# Patient Record
Sex: Male | Born: 1950 | Race: White | Hispanic: No | Marital: Married | Smoking: Former smoker
Health system: Southern US, Community
[De-identification: ages and names within clinical notes are randomized; demographics above are authoritative.]

## PROBLEM LIST (undated history)

## (undated) DIAGNOSIS — T7840XA Allergy, unspecified, initial encounter: Secondary | ICD-10-CM

## (undated) DIAGNOSIS — E785 Hyperlipidemia, unspecified: Secondary | ICD-10-CM

## (undated) DIAGNOSIS — G4730 Sleep apnea, unspecified: Secondary | ICD-10-CM

## (undated) DIAGNOSIS — B019 Varicella without complication: Secondary | ICD-10-CM

## (undated) DIAGNOSIS — K5792 Diverticulitis of intestine, part unspecified, without perforation or abscess without bleeding: Secondary | ICD-10-CM

## (undated) DIAGNOSIS — M1990 Unspecified osteoarthritis, unspecified site: Secondary | ICD-10-CM

## (undated) HISTORY — DX: Diverticulitis of intestine, part unspecified, without perforation or abscess without bleeding: K57.92

## (undated) HISTORY — DX: Hyperlipidemia, unspecified: E78.5

## (undated) HISTORY — DX: Varicella without complication: B01.9

## (undated) HISTORY — DX: Allergy, unspecified, initial encounter: T78.40XA

## (undated) HISTORY — DX: Unspecified osteoarthritis, unspecified site: M19.90

## (undated) HISTORY — PX: EYE SURGERY: SHX253

## (undated) HISTORY — DX: Sleep apnea, unspecified: G47.30

---
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
## 2001-07-10 ENCOUNTER — Ambulatory Visit (HOSPITAL_BASED_OUTPATIENT_CLINIC_OR_DEPARTMENT_OTHER): Admission: RE | Admit: 2001-07-10 | Discharge: 2001-07-10 | Payer: Self-pay | Admitting: Family Medicine

## 2002-11-15 HISTORY — PX: CARPAL TUNNEL RELEASE: SHX101

## 2004-06-12 ENCOUNTER — Encounter: Admission: RE | Admit: 2004-06-12 | Discharge: 2004-06-12 | Payer: Self-pay | Admitting: Family Medicine

## 2004-06-15 ENCOUNTER — Encounter: Admission: RE | Admit: 2004-06-15 | Discharge: 2004-06-15 | Payer: Self-pay | Admitting: Family Medicine

## 2004-08-28 ENCOUNTER — Ambulatory Visit (HOSPITAL_COMMUNITY): Admission: RE | Admit: 2004-08-28 | Discharge: 2004-08-28 | Payer: Self-pay | Admitting: Neurosurgery

## 2004-08-28 ENCOUNTER — Ambulatory Visit (HOSPITAL_BASED_OUTPATIENT_CLINIC_OR_DEPARTMENT_OTHER): Admission: RE | Admit: 2004-08-28 | Discharge: 2004-08-28 | Payer: Self-pay | Admitting: Neurosurgery

## 2007-02-27 ENCOUNTER — Ambulatory Visit (HOSPITAL_BASED_OUTPATIENT_CLINIC_OR_DEPARTMENT_OTHER): Admission: RE | Admit: 2007-02-27 | Discharge: 2007-02-27 | Payer: Self-pay | Admitting: Orthopedic Surgery

## 2007-11-16 HISTORY — PX: KNEE ARTHROSCOPY: SUR90

## 2011-07-29 ENCOUNTER — Encounter: Payer: Self-pay | Admitting: Gastroenterology

## 2012-08-10 ENCOUNTER — Encounter: Payer: Self-pay | Admitting: Gastroenterology

## 2012-08-28 ENCOUNTER — Encounter: Payer: Self-pay | Admitting: Medical

## 2012-11-15 HISTORY — PX: CATARACT EXTRACTION, BILATERAL: SHX1313

## 2012-12-04 ENCOUNTER — Ambulatory Visit: Payer: Self-pay | Admitting: Family Medicine

## 2013-01-15 ENCOUNTER — Ambulatory Visit: Payer: Self-pay | Admitting: Family Medicine

## 2013-02-05 ENCOUNTER — Ambulatory Visit (INDEPENDENT_AMBULATORY_CARE_PROVIDER_SITE_OTHER): Payer: BC Managed Care – PPO | Admitting: Family Medicine

## 2013-02-05 ENCOUNTER — Encounter: Payer: Self-pay | Admitting: *Deleted

## 2013-02-05 ENCOUNTER — Encounter: Payer: Self-pay | Admitting: Family Medicine

## 2013-02-05 ENCOUNTER — Encounter: Payer: Self-pay | Admitting: Gastroenterology

## 2013-02-05 VITALS — BP 130/100 | HR 90 | Temp 98.7°F | Ht 73.0 in | Wt 275.0 lb

## 2013-02-05 DIAGNOSIS — Z1211 Encounter for screening for malignant neoplasm of colon: Secondary | ICD-10-CM

## 2013-02-05 DIAGNOSIS — Z0000 Encounter for general adult medical examination without abnormal findings: Secondary | ICD-10-CM | POA: Insufficient documentation

## 2013-02-05 LAB — CBC WITH DIFFERENTIAL/PLATELET
Basophils Absolute: 0 10*3/uL (ref 0.0–0.1)
Basophils Relative: 0.3 % (ref 0.0–3.0)
Eosinophils Relative: 0.8 % (ref 0.0–5.0)
Lymphs Abs: 1.6 10*3/uL (ref 0.7–4.0)
Monocytes Relative: 8.3 % (ref 3.0–12.0)
Neutrophils Relative %: 69.7 % (ref 43.0–77.0)
Platelets: 237 10*3/uL (ref 150.0–400.0)

## 2013-02-05 LAB — LIPID PANEL
HDL: 47.9 mg/dL (ref >39.00)
VLDL: 27.4 mg/dL (ref 0.0–40.0)

## 2013-02-05 LAB — HEPATIC FUNCTION PANEL
Alkaline Phosphatase: 52 U/L (ref 39–117)
Bilirubin, Direct: 0.1 mg/dL (ref 0.0–0.3)

## 2013-02-05 LAB — BASIC METABOLIC PANEL
BUN: 12 mg/dL (ref 6–23)
CO2: 27 mEq/L (ref 19–32)
Calcium: 9.1 mg/dL (ref 8.4–10.5)
Chloride: 101 mEq/L (ref 96–112)
GFR: 93.58 mL/min (ref >60.00)
Sodium: 136 mEq/L (ref 135–145)

## 2013-02-05 LAB — TSH: TSH: 1.16 u[IU]/mL (ref 0.35–5.50)

## 2013-02-05 LAB — LDL CHOLESTEROL, DIRECT: Direct LDL: 147.3 mg/dL

## 2013-02-05 NOTE — Assessment & Plan Note (Signed)
Pt's PE WNL w/ exception of obesity.  Refer for colonoscopy.  EKG done- see document for interpretation.  Check labs.  Anticipatory guidance provided.

## 2013-02-05 NOTE — Progress Notes (Signed)
  Subjective:    Patient ID: Jesse [name], male    DOB: 06/06/1951, 62 y.o.   MRN: [id number]  HPI New to establish.  Previous MD- Mazzochi (last seen years ago)  CPE- overdue on colonoscopy.   Review of Systems Patient reports no vision/hearing changes, anorexia, fever ,adenopathy, persistant/recurrent hoarseness, swallowing issues, chest pain, palpitations, edema, persistant/recurrent cough, hemoptysis, dyspnea (rest,exertional, paroxysmal nocturnal), gastrointestinal  bleeding (melena, rectal bleeding), abdominal pain, excessive heart burn, GU symptoms (dysuria, hematuria, voiding/incontinence issues) syncope, focal weakness, memory loss, numbness & tingling, skin/hair/nail changes, depression, anxiety, abnormal bruising/bleeding, musculoskeletal symptoms/signs.     Objective:   Physical Exam BP 130/100  Pulse 90  Temp(Src) 98.7 F (37.1 C) (Oral)  Ht 6\' 1"  (1.854 m)  Wt 275 lb (124.739 kg)  BMI 36.29 kg/m2  SpO2 92%  General Appearance:    Alert, cooperative, no distress, appears stated age, overweight  Head:    Normocephalic, without obvious abnormality, atraumatic  Eyes:    PERRL, conjunctiva/corneas clear, EOM's intact  Ears:    Normal TM's and external ear canals, both ears  Nose:   Nares normal, septum midline, mucosa normal, no drainage   or sinus tenderness  Throat:   Lips, mucosa, and tongue normal; teeth and gums normal  Neck:   Supple, symmetrical, trachea midline, no adenopathy;       thyroid:  No enlargement/tenderness/nodules  Back:     Symmetric, no curvature, ROM normal, no CVA tenderness  Lungs:     Clear to auscultation bilaterally, respirations unlabored  Chest wall:    No tenderness or deformity  Heart:    Regular rate and rhythm, S1 and S2 normal, no murmur, rub   or gallop  Abdomen:     Soft, non-tender, bowel sounds active all four quadrants,    no masses, no organomegaly  Genitalia:    Normal male without lesion, discharge or tenderness  Rectal:     Normal tone, normal prostate, no masses or tenderness  Extremities:   Extremities normal, atraumatic, no cyanosis or edema  Pulses:   2+ and symmetric all extremities  Skin:   Skin color, texture, turgor normal, no rashes or lesions  Lymph nodes:   Cervical, supraclavicular, and axillary nodes normal  Neurologic:   CNII-XII intact. Normal strength, sensation and reflexes      throughout          Assessment & Plan:

## 2013-02-05 NOTE — Patient Instructions (Addendum)
Follow up in 3 months to recheck BP We'll notify you of your lab results and make any changes if needed Try and make healthy food choices and get activity when able We'll call you with your GI appt for the colonoscopy Call with any questions or concerns Welcome!  We're glad to have you! Happy Early [place] Ouch!

## 2013-03-14 ENCOUNTER — Encounter: Payer: BC Managed Care – PPO | Admitting: Gastroenterology

## 2013-03-19 ENCOUNTER — Encounter: Payer: Self-pay | Admitting: Gastroenterology

## 2013-03-19 ENCOUNTER — Ambulatory Visit (AMBULATORY_SURGERY_CENTER): Payer: BC Managed Care – PPO | Admitting: *Deleted

## 2013-03-19 VITALS — Ht 72.0 in | Wt 277.2 lb

## 2013-03-19 DIAGNOSIS — Z1211 Encounter for screening for malignant neoplasm of colon: Secondary | ICD-10-CM

## 2013-03-19 MED ORDER — NA SULFATE-K SULFATE-MG SULF 17.5-3.13-1.6 GM/177ML PO SOLN: ORAL | Status: DC

## 2013-03-20 ENCOUNTER — Encounter: Payer: BC Managed Care – PPO | Admitting: Gastroenterology

## 2013-04-02 ENCOUNTER — Encounter: Payer: BC Managed Care – PPO | Admitting: Gastroenterology

## 2013-05-14 ENCOUNTER — Encounter: Payer: Self-pay | Admitting: Family Medicine

## 2013-05-14 ENCOUNTER — Ambulatory Visit (INDEPENDENT_AMBULATORY_CARE_PROVIDER_SITE_OTHER): Payer: BC Managed Care – PPO | Admitting: Family Medicine

## 2013-05-14 VITALS — BP 120/82 | HR 80 | Temp 98.0°F | Ht 73.0 in | Wt 273.8 lb

## 2013-05-14 DIAGNOSIS — G4733 Obstructive sleep apnea (adult) (pediatric): Secondary | ICD-10-CM

## 2013-05-14 DIAGNOSIS — R030 Elevated blood-pressure reading, without diagnosis of hypertension: Secondary | ICD-10-CM

## 2013-05-14 NOTE — Patient Instructions (Addendum)
We'll call you with your pulmonary appt for the sleep apnea BP looks great! Call with any questions or concerns Have a great summer!

## 2013-05-14 NOTE — Assessment & Plan Note (Signed)
Improved.  BP normal today.  Asymptomatic.  Will follow at future visits.

## 2013-05-14 NOTE — Progress Notes (Signed)
  Subjective:    Patient ID: Jesse [name], male    DOB: [birth year], 62 y.o.   MRN: [id number]  HPI Elevated BP- much improved today over last visit.  No CP, SOB, HAs, visual changes, edema.  ? Sleep apnea- no trouble falling asleep but difficulty staying asleep.  Wife reports loud snoring and breathing pauses.  Had sleep study 11 yrs ago that indicated sleep apnea but did not return for f/u study to fit face mask or determine appropriate tx.  + daytime sleepiness.   Review of Systems For ROS see HPI     Objective:   Physical Exam  Vitals reviewed. Constitutional: He is oriented to person, place, and time. He appears well-developed and well-nourished. No distress.  HENT:  Head: Normocephalic and atraumatic.  Eyes: Conjunctivae and EOM are normal. Pupils are equal, round, and reactive to light.  Neck: Normal range of motion. Neck supple. No thyromegaly present.  Cardiovascular: Normal rate, regular rhythm, normal heart sounds and intact distal pulses.   No murmur heard. Pulmonary/Chest: Effort normal and breath sounds normal. No respiratory distress.  Abdominal: Soft. Bowel sounds are normal. He exhibits no distension.  Musculoskeletal: He exhibits no edema.  Lymphadenopathy:    He has no cervical adenopathy.  Neurological: He is alert and oriented to person, place, and time. No cranial nerve deficit.  Skin: Skin is warm and dry.  Psychiatric: He has a normal mood and affect. His behavior is normal.          Assessment & Plan:

## 2013-05-14 NOTE — Assessment & Plan Note (Signed)
New to provider.  Pt has hx of similar but never followed up as recommended.  Will refer to pulm for re-eval and tx prn.

## 2013-06-08 ENCOUNTER — Institutional Professional Consult (permissible substitution): Payer: BC Managed Care – PPO | Admitting: Pulmonary Disease

## 2013-07-02 ENCOUNTER — Encounter: Payer: Self-pay | Admitting: Family Medicine

## 2013-07-02 ENCOUNTER — Ambulatory Visit (INDEPENDENT_AMBULATORY_CARE_PROVIDER_SITE_OTHER): Payer: BC Managed Care – PPO | Admitting: Pulmonary Disease

## 2013-07-02 ENCOUNTER — Encounter: Payer: Self-pay | Admitting: Pulmonary Disease

## 2013-07-02 VITALS — BP 148/100 | HR 73 | Temp 98.2°F | Ht 72.75 in | Wt 273.0 lb

## 2013-07-02 DIAGNOSIS — H9190 Unspecified hearing loss, unspecified ear: Secondary | ICD-10-CM

## 2013-07-02 DIAGNOSIS — G4733 Obstructive sleep apnea (adult) (pediatric): Secondary | ICD-10-CM

## 2013-07-02 NOTE — Assessment & Plan Note (Signed)
The patient has a history of obstructive sleep apnea, although his sleep study from the early 2000 is not available for my review.  His current history is classic for sleep disordered breathing, and I have had a long discussion with him about the pathophysiology of sleep apnea.  I think he needs to have a sleep study at this point, but I am hesitant to do sleep testing given his frequent awakenings with delayed return to sleep thereafter.  I will schedule for a formal sleep study at the sleep Center, and arrange for followup once the results are available.

## 2013-07-02 NOTE — Progress Notes (Signed)
Subjective:    Patient ID: Jesse [name], male    DOB: 1951/08/25, 62 y.o.   MRN: [id number]  HPI The patient is a 62 year old male who has been asked to see for possible obstructive sleep apnea.  He apparently has had a sleep study in 2002, and was diagnosed with sleep apnea at that time.  However, he never returned for possible treatment.  The patient currently has been noted to have loud snoring as well as an abnormal breathing pattern during sleep.  He has frequent awakenings at night, sometimes associated with a delayed return to sleep.  He is not rested in the mornings upon arising, and states that he will fall asleep easily during the day with any period of inactivity.  He also falls asleep watching television or movies in the evening, and also has sleep pressure with driving longer distances.  The patient states his weight is up 50 pounds over the last 2 years, and his Epworth score today is 14.    Sleep Questionnaire What time do you typically go to bed?( Between what hours) 9-10 9-10 at 0915 on 07/02/13 by Maisie Fus, CMA How long does it take you to fall asleep? within minutes within minutes at 0915 on 07/02/13 by Maisie Fus, CMA How many times during the night do you wake up? 4 4 at 0915 on 07/02/13 by Maisie Fus, CMA What time do you get out of bed to start your day? 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 (In pounds) 50 lb (22.68 kg)50 lb (22.68 kg) increase at 0915 on 07/02/13 by Maisie Fus, CMA Have you ever had a sleep study before? Yes Yes at 0915 on 07/02/13 by Maisie Fus, CMA If yes, location of study? [facility] at 0915 on 07/02/13 by Maisie Fus, CMA If yes, date of study? 18yrs ago 89yrs ago at 0915 on  07/02/13 by Maisie Fus, CMA Do you currently use CPAP? No No at 0915 on 07/02/13 by Maisie Fus, CMA Do you wear oxygen at any time? No No at 0915 on 07/02/13 by Maisie Fus, CMA   Review of Systems  Constitutional: Negative for fever and unexpected weight change.  HENT: Positive for congestion. Negative for ear pain, nosebleeds, sore throat, rhinorrhea, sneezing, trouble swallowing, dental problem, postnasal drip and sinus pressure.   Eyes: Negative for redness and itching.  Respiratory: Negative for cough, chest tightness, shortness of breath and wheezing.   Cardiovascular: Negative for palpitations and leg swelling.  Gastrointestinal: Negative for nausea and vomiting.  Genitourinary: Negative for dysuria.  Musculoskeletal: Negative for joint swelling.  Skin: Negative for rash.  Neurological: Negative for headaches.  Hematological: Does not bruise/bleed easily.  Psychiatric/Behavioral: Negative for dysphoric mood. The patient is not nervous/anxious.        Objective:   Physical Exam Constitutional:  Obese male , no acute distress  HENT:  Nares patent without discharge, but narrowed bilat.  Oropharynx without exudate, palate and uvula are moderately elongated.   Eyes:  Perrla, eomi, no scleral icterus  Neck:  No JVD, no TMG  Cardiovascular:  Normal rate, regular rhythm, no rubs or gallops.  No murmurs        Intact distal  pulses  Pulmonary :  Normal breath sounds, no stridor or respiratory distress   No rales, rhonchi, or wheezing  Abdominal:  Soft, nondistended, bowel sounds present.  No tenderness noted.   Musculoskeletal:  No lower extremity edema noted.  Lymph Nodes:  No cervical lymphadenopathy noted  Skin:  No cyanosis noted  Neurologic:  Alert, appropriate, moves all 4 extremities without obvious deficit.         Assessment & Plan:

## 2013-07-02 NOTE — Patient Instructions (Addendum)
Will arrange for sleep study, and will see back to review once the report is available. Work on weight loss.

## 2013-07-03 ENCOUNTER — Encounter: Payer: Self-pay | Admitting: Family Medicine

## 2013-07-03 NOTE — Telephone Encounter (Signed)
Pt last seen on 05/14/13. Would you like to see pt first or would you like for me to place a referral?

## 2013-08-06 ENCOUNTER — Ambulatory Visit: Payer: BC Managed Care – PPO | Attending: Family Medicine | Admitting: Audiology

## 2013-08-06 DIAGNOSIS — H903 Sensorineural hearing loss, bilateral: Secondary | ICD-10-CM | POA: Insufficient documentation

## 2013-08-06 DIAGNOSIS — H93293 Other abnormal auditory perceptions, bilateral: Secondary | ICD-10-CM

## 2013-08-06 NOTE — Patient Instructions (Addendum)
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  You have excellent word recognition in quiet, that drops to poor in minimal background noise in each ear alone. However, when both ears have background noise, word recognition is 72%, which is good  1. CAPTEL - www.captel.com   2.  Hearing aid recommended  [facility] [address] [place], [place] [phone]  [facility] [address] Gita Kudo [place], [place]  [phone] [facility]  [place]  [phone] and [place]  [phone] [facility] Mady Haagensen [phone] and [place] [phone] [facility]  [phone]  [facility] [phone]  [facility] [phone], [place] [phone], Mady Haagensen [phone]   [facility]  [place]  [phone]  [employer] and [facility] also dispense hearing aids.    Hearing Loss A hearing loss is sometimes called deafness. Hearing loss may be partial or total. CAUSES Hearing loss may be caused by:  Wax in the ear canal.  Infection of the ear canal.  Infection of the middle ear.  Trauma to the ear or surrounding area.  Fluid in the middle ear.  A hole in the eardrum (perforated eardrum).  Exposure to loud sounds or music.  Problems with the hearing nerve.  Certain medications. Hearing loss without wax, infection, or a history of injury may mean that the nerve is involved. Hearing loss with severe dizziness, nausea and vomiting or ringing in the ear may suggest a hearing nerve irritation or problems in the middle or inner ear. If hearing loss is untreated, there is a greater likelihood for residual or permanent hearing loss. DIAGNOSIS A hearing test (audiometry) assesses hearing loss. The audiometry test needs to be performed by a hearing specialist  (audiologist). TREATMENT Treatment for recent onset of hearing loss may include:  Ear wax removal.  Medications that kill germs (antibiotics).  Cortisone medications.  Prompt follow up with the appropriate specialist. Return of hearing depends on the cause of your hearing loss, so proper medical follow-up is important. Some hearing loss may not be reversible, and a caregiver should discuss care and treatment options with you. SEEK MEDICAL CARE IF:   You have a severe headache, dizziness, or changes in vision.  You have new or increased weakness.  You develop repeated vomiting or other serious medical problems.  You have a fever. Document Released: 11/01/2005 Document Revised: 01/24/2012 Document Reviewed: 02/26/2010 [facility] Patient Information 2014 [place], [place].

## 2013-08-06 NOTE — Procedures (Signed)
[facility] [address] [place], [place] [address] [phone]  AUDIOLOGICAL EVALUATION  Name: Jesse [name] DOB:  Apr 09, 1951 MRN:  [id number]                                           Diagnosis: Hearing Loss Date: 08/06/2013    Referent: Neena Rhymes, MD  HISTORY: Jhalen Eley, age 62 y.o. years, was seen for an audiological evaluation and reports a "gradual hearing loss over the past 10 years in both ears."  Kamdyn denies tinnitus, vertigo or balance issues.  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         EVALUATION: Pure tone air and tone conduction was completed using conventional audiometry and inserts. Thresholds are symmetrical and are 35 dBHL at 250Hz ; 30 dBHL at 500Hz ; 40-45 dBHL at 1000Hz ; 55-60 dBHL at 1500Hz ; 65 dBHL at 2000Hz  - 4000Hz  and 65-75 dBHL at 8000Hz .  The hearing loss is sensorineural bilaterally except for slight left ear bone conduction fluctuation at 2000Hz  - 4000Hz  that is not considered significant. Speech reception is 40 dBHL using recorded spondee words. Word recognition is 92% at 70 dBHL in quiet in each ear. In minimal background noise word recognition drops to 36% in the right ear and 40% in the left ear.  However, with binaural words and speech at +5 dBH signal to noise word recognition improved to 72% correct. Otoscopic inspection reveals clear ear canals with visible tympanic membranes.  Tympanometry showed in the left ear was  (Type A)  and in the right ear was  (Type A).  Ipsilateral acoustic reflexes are present at 500Hz  and 1000Hz  and are absent at 2000Hz  and 4000Hz  bilaterally. Acoustic reflex decay is negative bilaterally.   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  He has excellent word recognition in quiet, that drops to poor in minimal background noise in each ear alone. However, when both ears have background noise, word recognition improves to 72%.  Therefore binaural amplification would be strongly recommended for Mr. Wehner.  Please note that the hearing loss appears symmetrical without sign of retrocochlear issues.   In addition,  Mr. Buer may benefit from the use of a computer program to be used at home to improve auditory processing function.  LACE by www.neurotone or Auditory Workout for apple products were recommended to improve hearing in increasing background noise.  Using one of these programs 10-15 minutes per day 4-5 days per week is strongly recommended. To monitor progress use the hearing test at www.hear-it.com before, during a few weeks of using these programs and then after completion -- it has been studied and found to be a valid measure.  RECOMMENDATIONS: 1.   Monitor hearing with a repeat audiological evaluation in 6-12 months (earlier if there is any change in hearing or ear pressure). 2.   Consider further evaluation by an Ear, Nose and Throat physician, especially if there is any change in hearing or tinnitus develops. 3.   Consider a hearing aid evaluation at the location of Graceson's choice.  A list of local hearing aid providers was given  to Mr. Treece at this visit. Amplification helps make the signal louder and therefore often improves hearing and word recognition.  Amplification has many forms including hearing aids in one or both ears, an assistive listening device which have a microphone and speaker such as a small handheld device and/or even a surround sound system of speakers.  Amplification may be covered by some insurances, but not all.  It is important to note that hearing aids must be individually fit according to the hearing test results and the ear shape.   Audiologists and hearing aid dealers in [place] must be licensed in order to dispense hearing aids.  In addition, a trial period is mandated by law in our state because often amplification must be tried and then evaluated in order to determine benefit. There are many excellent choices when it comes to amplification in our area and providers are listed in the phone book under hearing aids, may be affiliated with Ear, Nose and Throat physicians, are located at [place] and [employer] as well as the [employer] [facility] speech and hearing center. 4.  To improve listening in background noise, consider the use of the at home computer programs that help improve hearing in background noise. 5.  Strategies that help improve hearing include:  A) Face the speaker directly. Optimal is having the speakers face well - lit.  Unless amplified, being within 3-6 feet of the speaker will enhance word recognition.  B) Avoid having the speaker back-lit as this will minimize the ability to use cues from lip-reading, facial expression and gestures.  C)  Word recognition is poorer in background noise. For optimal word recognition, turn off the TV, radio or noisy fan when engaging in conversation. In a restaurant, try to sit away from noise sources and close to the primary speaker.   D)  Ask for topic clarification from time to time in order to remain in the conversation.  Most people don't mind repeating or clarifying a point when asked.  If needed, explain the difficulty hearing in background noise or hearing loss.  Deborah L. Kate Sable Au.D., CCC-A Doctor of Audiology  08/06/2013  cc: Neena Rhymes, MD

## 2013-08-07 ENCOUNTER — Ambulatory Visit (HOSPITAL_BASED_OUTPATIENT_CLINIC_OR_DEPARTMENT_OTHER): Payer: BC Managed Care – PPO | Attending: Pulmonary Disease | Admitting: Radiology

## 2013-08-07 VITALS — Ht 72.0 in | Wt 270.0 lb

## 2013-08-07 DIAGNOSIS — G4733 Obstructive sleep apnea (adult) (pediatric): Secondary | ICD-10-CM

## 2013-08-07 DIAGNOSIS — E669 Obesity, unspecified: Secondary | ICD-10-CM | POA: Insufficient documentation

## 2013-08-15 DIAGNOSIS — G4733 Obstructive sleep apnea (adult) (pediatric): Secondary | ICD-10-CM

## 2013-08-15 NOTE — Sleep Study (Signed)
[facility]  NAME: Jesse [name] DATE OF BIRTH:  [birth year] MEDICAL RECORD NUMBER [id number]  LOCATION: [facility]  PHYSICIAN: Barbaraann Share  DATE OF STUDY: 08/07/2013  SLEEP STUDY TYPE: Nocturnal Polysomnogram               REFERRING PHYSICIAN: Nasia Cannan, Maree Krabbe, MD  INDICATION FOR STUDY: Hypersomnia with sleep apnea  EPWORTH SLEEPINESS SCORE:  12 HEIGHT: 6' (182.9 cm)  WEIGHT: 270 lb (122.471 kg)    Body mass index is 36.61 kg/(m^2).  NECK SIZE: 18.5 in.   SLEEP ARCHITECTURE:  The pt had a total sleep time of , with no SWS and decreased REM.  Sleep onset latency was normal,and sleep efficiency was poor at 61%.  RESPIRATORY DATA: the patient was found to have 168 apneas, as well as 106 obstructive hypopneas, giving him an apnea-hypopnea index of 67 events per hour.  The events occurred in all body positions, and there was moderate to severe snoring noted throughout.  OXYGEN DATA: there was oxygen desaturation as low as 80% with the patient's a truck of events.  CARDIAC DATA: occasional PACs noted, but no clinically significant arrhythmias were seen  MOVEMENT/PARASOMNIA: the patient had no significant leg jerks or other abnormal behaviors noted.  IMPRESSION/ RECOMMENDATION:   #1 severe obstructive sleep apnea/hypopnea syndrome, with an AHI of 67 events per hour and oxygen desaturation as low as 80%.  Treatment for this degree of sleep apnea should focus on CPAP as well as aggressive weight loss. #2 occasional PAC noted, but no clinically significant arrhythmias were seen.  Barbaraann Share Diplomate, American Board of Sleep Medicine  ELECTRONICALLY SIGNED ON:  08/15/2013, 9:01 AM [facility] PH: [phone]   FX: [phone] ACCREDITED BY THE AMERICAN ACADEMY OF SLEEP MEDICINE

## 2013-08-17 ENCOUNTER — Telehealth: Payer: Self-pay | Admitting: *Deleted

## 2013-08-17 NOTE — Telephone Encounter (Signed)
I called pt on home # and cell # to schedule OV to discuss sleep study results. I had to leave message on both #'s. WCB

## 2013-08-17 NOTE — Telephone Encounter (Signed)
Pt coming in 08/31/13

## 2013-08-17 NOTE — Telephone Encounter (Signed)
Pt scheduled ov with KC.Jesse [name]

## 2013-08-31 ENCOUNTER — Encounter: Payer: Self-pay | Admitting: Pulmonary Disease

## 2013-08-31 ENCOUNTER — Ambulatory Visit (INDEPENDENT_AMBULATORY_CARE_PROVIDER_SITE_OTHER): Payer: BC Managed Care – PPO | Admitting: Pulmonary Disease

## 2013-08-31 VITALS — BP 142/88 | HR 94 | Ht 72.0 in | Wt 275.0 lb

## 2013-08-31 DIAGNOSIS — G4733 Obstructive sleep apnea (adult) (pediatric): Secondary | ICD-10-CM

## 2013-08-31 DIAGNOSIS — Z23 Encounter for immunization: Secondary | ICD-10-CM

## 2013-08-31 NOTE — Patient Instructions (Signed)
Will arrange for cpap, and please call if you are having issues with tolerance Work on weight loss followup with me in 6 weeks.

## 2013-08-31 NOTE — Assessment & Plan Note (Signed)
The patient has severe obstructive sleep apnea by his recent sleep study, and I have explained that his best treatment is weight loss while wearing CPAP.  He is agreeable to trying this.  I have also encouraged him to work aggressively on weight loss. I will set the patient up on cpap at a moderate pressure level to allow for desensitization, and will troubleshoot the device over the next 4-6weeks if needed.  The pt is to call me if having issues with tolerance.  Will then optimize the pressure once patient is able to wear cpap on a consistent basis.

## 2013-08-31 NOTE — Progress Notes (Signed)
  Subjective:    Patient ID: Jesse [name], male    DOB: [birth year], 62 y.o.   MRN: [id number]  HPI The patient comes in today for followup of his recent sleep study.  He was found to have severe OSA, with an AHI of 67 events per hour.  I have reviewed his study with him in detail, and answered all of his questions.   Review of Systems  Constitutional: Negative for fever and unexpected weight change.  HENT: Negative for congestion, dental problem, ear pain, nosebleeds, postnasal drip, rhinorrhea, sinus pressure, sneezing, sore throat and trouble swallowing.   Eyes: Negative for redness and itching.  Respiratory: Negative for cough, chest tightness, shortness of breath and wheezing.   Cardiovascular: Negative for palpitations and leg swelling.  Gastrointestinal: Negative for nausea and vomiting.  Genitourinary: Negative for dysuria.  Musculoskeletal: Negative for joint swelling.  Skin: Negative for rash.  Neurological: Negative for headaches.  Hematological: Does not bruise/bleed easily.  Psychiatric/Behavioral: Negative for dysphoric mood. The patient is not nervous/anxious.        Objective:   Physical Exam Overweight male in no acute distress Nose without purulence or discharge noted Neck without lymphadenopathy or thyromegaly Lower extremities with mild edema, no cyanosis Alert and oriented, moves all 4 extremities.       Assessment & Plan:

## 2013-10-01 ENCOUNTER — Encounter: Payer: Self-pay | Admitting: Family Medicine

## 2013-10-01 ENCOUNTER — Ambulatory Visit (INDEPENDENT_AMBULATORY_CARE_PROVIDER_SITE_OTHER): Payer: BC Managed Care – PPO | Admitting: Family Medicine

## 2013-10-01 VITALS — BP 130/80 | HR 93 | Temp 98.1°F | Resp 17 | Wt 274.2 lb

## 2013-10-01 DIAGNOSIS — J449 Chronic obstructive pulmonary disease, unspecified: Secondary | ICD-10-CM | POA: Insufficient documentation

## 2013-10-01 DIAGNOSIS — M25511 Pain in right shoulder: Secondary | ICD-10-CM

## 2013-10-01 DIAGNOSIS — M25512 Pain in left shoulder: Secondary | ICD-10-CM | POA: Insufficient documentation

## 2013-10-01 DIAGNOSIS — M25519 Pain in unspecified shoulder: Secondary | ICD-10-CM

## 2013-10-01 MED ORDER — PROMETHAZINE-DM 6.25-15 MG/5ML PO SYRP: 5.0000 mL | ORAL_SOLUTION | Freq: Four times a day (QID) | ORAL | Status: DC | PRN

## 2013-10-01 MED ORDER — NAPROXEN 500 MG PO TABS: 500.0000 mg | ORAL_TABLET | Freq: Two times a day (BID) | ORAL | Status: DC

## 2013-10-01 MED ORDER — AZITHROMYCIN 250 MG PO TABS: ORAL_TABLET | ORAL | Status: DC

## 2013-10-01 NOTE — Assessment & Plan Note (Signed)
New.  Pt is former cigarette smoker and uses CPAP nightly.  Start Zpack to cover for any atypicals.  Cough meds prn.  Reviewed supportive care and red flags that should prompt return.  Pt expressed understanding and is in agreement w/ plan.

## 2013-10-01 NOTE — Progress Notes (Signed)
  Subjective:    Patient ID: Jesse [name], male    DOB: 10/31/1951, 62 y.o.   MRN: [id number]  HPI Pre visit review using our clinic review tool, if applicable. No additional management support is needed unless otherwise documented below in the visit note.  URI- sxs started 1 week ago w/ nasal congestion.  + sick contacts.  Last week had sinus pain, PND, sore throat.  Now having productive cough- green phlegm.  No fever.  Some SOB when lying flat- unable to use CPAP due to cough.  No N/V/D.  L shoulder pain- started on Friday, 'i thought i slept on it wrong'.  Pain w/ abduction.  No pain w/ forward flexion.  No weakness.  No radiation of pain.   Review of Systems For ROS see HPI     Objective:   Physical Exam  Vitals reviewed. Constitutional: He appears well-developed and well-nourished. No distress.  HENT:  Head: Normocephalic and atraumatic.  Right Ear: Tympanic membrane normal.  Left Ear: Tympanic membrane normal.  Nose: No mucosal edema or rhinorrhea. Right sinus exhibits no maxillary sinus tenderness and no frontal sinus tenderness. Left sinus exhibits no maxillary sinus tenderness and no frontal sinus tenderness.  Mouth/Throat: Mucous membranes are normal. No oropharyngeal exudate, posterior oropharyngeal edema or posterior oropharyngeal erythema.  Eyes: Conjunctivae and EOM are normal. Pupils are equal, round, and reactive to light.  Neck: Normal range of motion. Neck supple.  Cardiovascular: Normal rate, regular rhythm, normal heart sounds and intact distal pulses.   Pulmonary/Chest: Effort normal and breath sounds normal. No respiratory distress. He has no wheezes.  + hacking cough  Musculoskeletal: He exhibits no edema.  L shoulder pain w/ abduction.  Good forward flexion  Lymphadenopathy:    He has no cervical adenopathy.  Skin: Skin is warm and dry.          Assessment & Plan:

## 2013-10-01 NOTE — Patient Instructions (Signed)
Follow up as needed Start the Zpack as directed for bronchitis Use the cough syrup as needed Start the Naproxen twice daily- take w/ food- for the shoulder If no improvement in pain after 2 weeks, call and we'll send you to ortho Call with any questions or concerns Hang in there!

## 2013-10-01 NOTE — Assessment & Plan Note (Signed)
New.  Start scheduled NSAIDs.  If no improvement, refer to ortho.  Reviewed supportive care and red flags that should prompt return.  Pt expressed understanding and is in agreement w/ plan.

## 2013-10-08 ENCOUNTER — Encounter: Payer: Self-pay | Admitting: Pulmonary Disease

## 2013-10-15 ENCOUNTER — Other Ambulatory Visit: Payer: Self-pay | Admitting: Pulmonary Disease

## 2013-10-15 ENCOUNTER — Ambulatory Visit (INDEPENDENT_AMBULATORY_CARE_PROVIDER_SITE_OTHER): Payer: BC Managed Care – PPO | Admitting: Pulmonary Disease

## 2013-10-15 ENCOUNTER — Encounter: Payer: Self-pay | Admitting: Pulmonary Disease

## 2013-10-15 VITALS — BP 138/90 | HR 87 | Temp 97.8°F | Ht 72.5 in | Wt 272.8 lb

## 2013-10-15 DIAGNOSIS — G4733 Obstructive sleep apnea (adult) (pediatric): Secondary | ICD-10-CM

## 2013-10-15 NOTE — Assessment & Plan Note (Signed)
The patient has much improved sleep and daytime alertness since being on CPAP. We will need to optimize his pressure on the automatic setting, and I've encouraged him to work aggressively on weight loss. I will see him back in 6 months if doing well. Care Plan:  At this point, will arrange for the patient's machine to be changed over to auto mode for 2 weeks to optimize their pressure.  I will review the downloaded data once sent by dme, and also evaluate for compliance, leaks, and residual osa.  I will call the patient and dme to discuss the results, and have the patient's machine set appropriately.  This will serve as the pt's cpap pressure titration.

## 2013-10-15 NOTE — Progress Notes (Signed)
   Subjective:    Patient ID: Jesse [name], male    DOB: [birth year], 62 y.o.   MRN: [id number]  HPI The patient comes in today for followup of his obstructive sleep apnea. He has been using CPAP compliantly by his download, and is having no significant mask leaks. He is still having some breakthrough apnea, but I have reminded him that we have yet to optimize his pressure. He has seen significant improvement in his sleep and daytime alertness since being on CPAP.   Review of Systems  Constitutional: Negative for fever and unexpected weight change.  HENT: Negative for congestion, dental problem, ear pain, nosebleeds, postnasal drip, rhinorrhea, sinus pressure, sneezing, sore throat and trouble swallowing.   Eyes: Negative for redness and itching.  Respiratory: Negative for cough, chest tightness, shortness of breath and wheezing.   Cardiovascular: Negative for palpitations and leg swelling.  Gastrointestinal: Negative for nausea and vomiting.  Genitourinary: Negative for dysuria.  Musculoskeletal: Negative for joint swelling.  Skin: Negative for rash.  Neurological: Negative for headaches.  Hematological: Does not bruise/bleed easily.  Psychiatric/Behavioral: Negative for dysphoric mood. The patient is not nervous/anxious.        Objective:   Physical Exam Overweight male in no acute distress Nose without purulence or discharge noted No skin breakdown or pressure necrosis from the CPAP mask Neck without lymphadenopathy or thyromegaly Lower extremities with mild edema, no cyanosis Alert and oriented, moves all 4 extremities.       Assessment & Plan:

## 2013-10-15 NOTE — Patient Instructions (Signed)
Will change your machine over to the auto setting for the next 2-3 weeks, and will let you know your optimal pressure once the download is available. Work on weight loss followup with me in 6mos if doing well, but call if having issues with your cpap.

## 2013-11-18 ENCOUNTER — Other Ambulatory Visit: Payer: Self-pay | Admitting: Pulmonary Disease

## 2013-11-18 DIAGNOSIS — G4733 Obstructive sleep apnea (adult) (pediatric): Secondary | ICD-10-CM

## 2014-04-17 ENCOUNTER — Encounter: Payer: Self-pay | Admitting: Pulmonary Disease

## 2014-04-17 ENCOUNTER — Ambulatory Visit (INDEPENDENT_AMBULATORY_CARE_PROVIDER_SITE_OTHER): Payer: BC Managed Care – PPO | Admitting: Pulmonary Disease

## 2014-04-17 VITALS — BP 130/82 | HR 68 | Temp 98.5°F | Ht 72.0 in | Wt 276.0 lb

## 2014-04-17 DIAGNOSIS — G4733 Obstructive sleep apnea (adult) (pediatric): Secondary | ICD-10-CM

## 2014-04-17 NOTE — Patient Instructions (Signed)
Continue with cpap, and keep up with mask changes and supplies. Work on weight loss followup with me again in one year.  

## 2014-04-17 NOTE — Progress Notes (Signed)
   Subjective:    Patient ID: Jesse [name], male    DOB: Jul 27, 1951, 63 y.o.   MRN: [id number]  HPI The patient comes in today for followup of his obstructive sleep apnea. He is wearing CPAP compliantly, and has no issues with his mask fit or pressure. He feels that he is sleeping well with the device, and is satisfied with his daytime alertness. He has not been keeping up with his mask cushion changes on a regular basis. The patient's weight is up a few pounds from the last visit.   Review of Systems  Constitutional: Negative for fever and unexpected weight change.  HENT: Negative for congestion, dental problem, ear pain, nosebleeds, postnasal drip, rhinorrhea, sinus pressure, sneezing, sore throat and trouble swallowing.   Eyes: Negative for redness and itching.  Respiratory: Negative for cough, chest tightness, shortness of breath and wheezing.   Cardiovascular: Negative for palpitations and leg swelling.  Gastrointestinal: Negative for nausea and vomiting.  Genitourinary: Negative for dysuria.  Musculoskeletal: Negative for joint swelling.  Skin: Negative for rash.  Neurological: Negative for headaches.  Hematological: Does not bruise/bleed easily.  Psychiatric/Behavioral: Negative for dysphoric mood. The patient is not nervous/anxious.        Objective:   Physical Exam Obese male in no acute distress Nose without purulence or discharge noted Neck without lymphadenopathy or thyromegaly No skin breakdown or pressure necrosis from the CPAP mass Lower extremities without edema, no cyanosis Alert and oriented, moves all 4 extremities.       Assessment & Plan:

## 2014-04-17 NOTE — Assessment & Plan Note (Signed)
The patient is been wearing CPAP compliantly, and feels that he has responded well to treatment. I've asked him to keep up with his mask cushion changes, and to work aggressively on weight loss. I will see him back in one year if he is doing well.

## 2014-08-08 ENCOUNTER — Ambulatory Visit (INDEPENDENT_AMBULATORY_CARE_PROVIDER_SITE_OTHER): Payer: BC Managed Care – PPO | Admitting: Family Medicine

## 2014-08-08 ENCOUNTER — Encounter: Payer: Self-pay | Admitting: Family Medicine

## 2014-08-08 VITALS — BP 130/78 | HR 72 | Temp 98.0°F | Resp 16 | Ht 72.75 in | Wt 264.5 lb

## 2014-08-08 DIAGNOSIS — L03211 Cellulitis of face: Secondary | ICD-10-CM | POA: Insufficient documentation

## 2014-08-08 DIAGNOSIS — Z0000 Encounter for general adult medical examination without abnormal findings: Secondary | ICD-10-CM

## 2014-08-08 DIAGNOSIS — L0201 Cutaneous abscess of face: Secondary | ICD-10-CM

## 2014-08-08 LAB — LIPID PANEL
CHOL/HDL RATIO: 6
Cholesterol: 204 mg/dL — ABNORMAL HIGH (ref 0–200)
HDL: 35.3 mg/dL — AB (ref >39.00)
LDL CALC: 150 mg/dL — AB (ref 0–99)
NONHDL: 168.7
Triglycerides: 94 mg/dL (ref 0.0–149.0)
VLDL: 18.8 mg/dL (ref 0.0–40.0)

## 2014-08-08 LAB — BASIC METABOLIC PANEL
BUN: 15 mg/dL (ref 6–23)
CALCIUM: 9.2 mg/dL (ref 8.4–10.5)
CO2: 28 mEq/L (ref 19–32)
Chloride: 101 mEq/L (ref 96–112)
Creatinine, Ser: 1 mg/dL (ref 0.4–1.5)
GFR: 81.29 mL/min (ref >60.00)
GLUCOSE: 105 mg/dL — AB (ref 70–99)
POTASSIUM: 4.5 meq/L (ref 3.5–5.1)
SODIUM: 137 meq/L (ref 135–145)

## 2014-08-08 LAB — HEPATIC FUNCTION PANEL
ALBUMIN: 4.3 g/dL (ref 3.5–5.2)
ALT: 27 U/L (ref 0–53)
AST: 26 U/L (ref 0–37)
Alkaline Phosphatase: 64 U/L (ref 39–117)
BILIRUBIN TOTAL: 0.9 mg/dL (ref 0.2–1.2)
Bilirubin, Direct: 0.2 mg/dL (ref 0.0–0.3)
Total Protein: 7.5 g/dL (ref 6.0–8.3)

## 2014-08-08 LAB — CBC WITH DIFFERENTIAL/PLATELET
BASOS ABS: 0 10*3/uL (ref 0.0–0.1)
Basophils Relative: 0.5 % (ref 0.0–3.0)
EOS PCT: 1.1 % (ref 0.0–5.0)
Eosinophils Absolute: 0.1 10*3/uL (ref 0.0–0.7)
HCT: 48.5 % (ref 39.0–52.0)
HEMOGLOBIN: 15.8 g/dL (ref 13.0–17.0)
LYMPHS ABS: 1.9 10*3/uL (ref 0.7–4.0)
Lymphocytes Relative: 20.8 % (ref 12.0–46.0)
MCHC: 32.6 g/dL (ref 30.0–36.0)
MCV: 84.9 fl (ref 78.0–100.0)
Monocytes Absolute: 1.1 10*3/uL — ABNORMAL HIGH (ref 0.1–1.0)
Monocytes Relative: 12.2 % — ABNORMAL HIGH (ref 3.0–12.0)
NEUTROS ABS: 5.9 10*3/uL (ref 1.4–7.7)
NEUTROS PCT: 65.4 % (ref 43.0–77.0)
PLATELETS: 252 10*3/uL (ref 150.0–400.0)
RBC: 5.72 Mil/uL (ref 4.22–5.81)
RDW: 14.1 % (ref 11.5–15.5)
WBC: 9.1 10*3/uL (ref 4.0–10.5)

## 2014-08-08 LAB — TSH: TSH: 0.81 u[IU]/mL (ref 0.35–4.50)

## 2014-08-08 LAB — PSA: PSA: 2.31 ng/mL (ref 0.10–4.00)

## 2014-08-08 MED ORDER — SULFAMETHOXAZOLE-TMP DS 800-160 MG PO TABS: 1.0000 | ORAL_TABLET | Freq: Two times a day (BID) | ORAL | Status: DC

## 2014-08-08 NOTE — Progress Notes (Signed)
Pre visit review using our clinic review tool, if applicable. No additional management support is needed unless otherwise documented below in the visit note. 

## 2014-08-08 NOTE — Patient Instructions (Signed)
Follow up in 1 year or as needed We'll notify you of your lab results and make any changes if needed Start the Bactrim twice daily (w/ food) for the cellulitis Please watch the ulcer in the roof of your mouth (R side)- if no improvement, discuss w/ your dentist Complete the cologuard as directed in place of colonoscopy Schedule a nurse visit at your convenience for the Flu and Tdap when you have finished your antibiotics Call with any questions or concerns Happy Fall!

## 2014-08-08 NOTE — Progress Notes (Signed)
   Subjective:    Patient ID: Jesse [name], male    DOB: November 27, 1950, 63 y.o.   MRN: [id number]  HPI CPE- pt has lost 12 lbs since 6/15- 'i eat less crap'.  Pt is due for colonoscopy, sister had 'bad experience' and resulted w/ a colostomy bag.  Pt open to idea of cologuard.  Facial cellulitis- pt had infected upper tooth on R, was 'cleaned out' by the dentist and root canal is scheduled.  Pt was started on PCN but reports that he had increased facial swelling on abx.  Stopped abx yesterday and swelling is somewhat improved but face is still red and warm to touch.  Not painful.  Wife reports that area was initially firm like a pimple.   Review of Systems Patient reports no vision changes, anorexia, fever ,adenopathy, persistant/recurrent hoarseness, swallowing issues, chest pain, palpitations, edema, persistant/recurrent cough, hemoptysis, dyspnea (rest,exertional, paroxysmal nocturnal), gastrointestinal  bleeding (melena, rectal bleeding), abdominal pain, excessive heart burn, GU symptoms (dysuria, hematuria, voiding/incontinence issues) syncope, focal weakness, memory loss, numbness & tingling, skin/hair/nail changes, depression, anxiety, abnormal bruising/bleeding, musculoskeletal symptoms/signs.   + hearing loss    Objective:   Physical Exam General Appearance:    Alert, cooperative, no distress, appears stated age, obese  Head:    Normocephalic, without obvious abnormality, atraumatic  Eyes:    PERRL, conjunctiva/corneas clear, EOM's intact, fundi    benign, both eyes       Ears:    Normal TM's and external ear canals, both ears  Nose:   Nares normal, septum midline, mucosa normal, no drainage   or sinus tenderness  Throat:   Lips, mucosa, and tongue normal; teeth and gums normal  Neck:   Supple, symmetrical, trachea midline, no adenopathy;       thyroid:  No enlargement/tenderness/nodules  Back:     Symmetric, no curvature, ROM normal, no CVA tenderness  Lungs:     Clear to  auscultation bilaterally, respirations unlabored  Chest wall:    No tenderness or deformity  Heart:    Regular rate and rhythm, S1 and S2 normal, no murmur, rub   or gallop  Abdomen:     Soft, non-tender, bowel sounds active all four quadrants,    no masses, no organomegaly  Genitalia:    Normal male without lesion, masses,discharge or tenderness  Rectal:    Deferred at pt's request  Extremities:   Extremities normal, atraumatic, no cyanosis or edema  Pulses:   2+ and symmetric all extremities  Skin:   R facial swelling and erythema, warm to touch, no TTP  Lymph nodes:   Cervical, supraclavicular, and axillary nodes normal  Neurologic:   CNII-XII intact. Normal strength, sensation and reflexes      throughout          Assessment & Plan:

## 2014-08-08 NOTE — Assessment & Plan Note (Signed)
New.  Discussed w/ pt and wife that I doubt his unilateral facial redness and swelling is due to PCN but did go ahead and add it to his allergy list.  Will broaden the abx spectrum and start bactrim.  Reviewed supportive care and red flags that should prompt return.  Pt expressed understanding and is in agreement w/ plan.

## 2014-08-08 NOTE — Assessment & Plan Note (Signed)
Pt's PE WNL w/ exception of facial cellulitis.  Overdue on colonoscopy but refusing at this time.  Is willing to do cologuard- order faxed.  Pt declined DRE today, will get PSA.  Check labs.  No flu or Tdap due to need for abx due to cellulitis.

## 2014-08-09 ENCOUNTER — Other Ambulatory Visit: Payer: Self-pay | Admitting: General Practice

## 2014-08-09 ENCOUNTER — Encounter: Payer: Self-pay | Admitting: Family Medicine

## 2014-08-09 DIAGNOSIS — E785 Hyperlipidemia, unspecified: Secondary | ICD-10-CM

## 2014-08-09 MED ORDER — ATORVASTATIN CALCIUM 10 MG PO TABS: 10.0000 mg | ORAL_TABLET | Freq: Every day | ORAL | Status: DC

## 2014-09-22 LAB — COLOGUARD: Cologuard: NEGATIVE

## 2014-09-30 ENCOUNTER — Ambulatory Visit: Payer: BC Managed Care – PPO | Admitting: *Deleted

## 2014-09-30 ENCOUNTER — Other Ambulatory Visit (INDEPENDENT_AMBULATORY_CARE_PROVIDER_SITE_OTHER): Payer: BC Managed Care – PPO

## 2014-09-30 ENCOUNTER — Ambulatory Visit (INDEPENDENT_AMBULATORY_CARE_PROVIDER_SITE_OTHER): Payer: BC Managed Care – PPO | Admitting: *Deleted

## 2014-09-30 DIAGNOSIS — Z23 Encounter for immunization: Secondary | ICD-10-CM

## 2014-09-30 DIAGNOSIS — E785 Hyperlipidemia, unspecified: Secondary | ICD-10-CM

## 2014-09-30 LAB — HEPATIC FUNCTION PANEL
ALBUMIN: 4.3 g/dL (ref 3.5–5.2)
ALT: 25 U/L (ref 0–53)
AST: 22 U/L (ref 0–37)
Alkaline Phosphatase: 56 U/L (ref 39–117)
Bilirubin, Direct: 0 mg/dL (ref 0.0–0.3)
TOTAL PROTEIN: 7.3 g/dL (ref 6.0–8.3)
Total Bilirubin: 0.4 mg/dL (ref 0.2–1.2)

## 2014-10-08 ENCOUNTER — Encounter: Payer: Self-pay | Admitting: Family Medicine

## 2015-01-05 ENCOUNTER — Other Ambulatory Visit: Payer: Self-pay | Admitting: Family Medicine

## 2015-01-06 NOTE — Telephone Encounter (Signed)
Med filled.  

## 2015-02-09 ENCOUNTER — Other Ambulatory Visit: Payer: Self-pay | Admitting: Family Medicine

## 2015-02-11 ENCOUNTER — Encounter: Payer: Self-pay | Admitting: Family Medicine

## 2015-02-12 MED ORDER — ATORVASTATIN CALCIUM 10 MG PO TABS: 10.0000 mg | ORAL_TABLET | Freq: Every day | ORAL | Status: DC

## 2015-08-11 ENCOUNTER — Ambulatory Visit (INDEPENDENT_AMBULATORY_CARE_PROVIDER_SITE_OTHER): Payer: BLUE CROSS/BLUE SHIELD | Admitting: Pulmonary Disease

## 2015-08-11 ENCOUNTER — Encounter: Payer: BC Managed Care – PPO | Admitting: Family Medicine

## 2015-08-11 ENCOUNTER — Encounter: Payer: Self-pay | Admitting: Pulmonary Disease

## 2015-08-11 VITALS — BP 122/86 | HR 78 | Ht 72.0 in | Wt 276.2 lb

## 2015-08-11 DIAGNOSIS — G4733 Obstructive sleep apnea (adult) (pediatric): Secondary | ICD-10-CM | POA: Diagnosis not present

## 2015-08-11 DIAGNOSIS — Z23 Encounter for immunization: Secondary | ICD-10-CM | POA: Diagnosis not present

## 2015-08-11 NOTE — Assessment & Plan Note (Signed)
CPAP is set at 18 cm Flu shot  Weight loss encouraged, compliance with goal of at least 6 hrs every night is the expectation. Advised against medications with sedative side effects Cautioned against driving when sleepy - understanding that sleepiness will vary on a day to day basis

## 2015-08-11 NOTE — Patient Instructions (Signed)
Your CPAP is set at 18 cm Flu shot

## 2015-08-11 NOTE — Progress Notes (Addendum)
   Subjective:    Patient ID: Jesse [name], male    DOB: [birth year], 64 y.o.   MRN: [id number]  HPI  Chief Complaint  Patient presents with  . Sleep Apnea    Former [facility] patient; Wearing CPAP every night.  no concerns.  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  Review of Systems neg for any significant sore throat, dysphagia, itching, sneezing, nasal congestion or excess/ purulent secretions, fever, chills, sweats, unintended wt loss, pleuritic or exertional cp, hempoptysis, orthopnea pnd or change in chronic leg swelling. Also denies presyncope, palpitations, heartburn, abdominal pain, nausea, vomiting, diarrhea or change in bowel or urinary habits, dysuria,hematuria, rash, arthralgias, visual complaints, headache, numbness weakness or ataxia.     Objective:   Physical Exam  Gen. Pleasant, obese, in no distress ENT - no lesions, no post nasal drip Neck: No JVD, no thyromegaly, no carotid bruits Lungs: no use of accessory muscles, no dullness to percussion, decreased without rales or rhonchi  Cardiovascular: Rhythm regular, heart sounds  normal, no murmurs or gallops, no peripheral edema Musculoskeletal: No deformities, no cyanosis or clubbing , no tremors       Assessment & Plan:

## 2015-08-11 NOTE — Addendum Note (Signed)
Addended by: Rigoberto Noel on: 08/11/2015 03:24 PM   Modules accepted: Miquel Dunn

## 2015-08-18 ENCOUNTER — Encounter: Payer: Self-pay | Admitting: Family Medicine

## 2015-08-18 ENCOUNTER — Ambulatory Visit (INDEPENDENT_AMBULATORY_CARE_PROVIDER_SITE_OTHER): Payer: BLUE CROSS/BLUE SHIELD | Admitting: Family Medicine

## 2015-08-18 VITALS — BP 140/88 | HR 65 | Temp 97.5°F | Resp 20 | Ht 72.0 in | Wt 273.6 lb

## 2015-08-18 DIAGNOSIS — Z0000 Encounter for general adult medical examination without abnormal findings: Secondary | ICD-10-CM | POA: Diagnosis not present

## 2015-08-18 DIAGNOSIS — M25512 Pain in left shoulder: Secondary | ICD-10-CM

## 2015-08-18 LAB — CBC WITH DIFFERENTIAL/PLATELET
Basophils Absolute: 0 10*3/uL (ref 0.0–0.1)
Basophils Relative: 0.4 % (ref 0.0–3.0)
EOS PCT: 0.8 % (ref 0.0–5.0)
Eosinophils Absolute: 0.1 10*3/uL (ref 0.0–0.7)
HCT: 49.2 % (ref 39.0–52.0)
HEMOGLOBIN: 15.8 g/dL (ref 13.0–17.0)
LYMPHS PCT: 28.7 % (ref 12.0–46.0)
Lymphs Abs: 2 10*3/uL (ref 0.7–4.0)
MCHC: 32.2 g/dL (ref 30.0–36.0)
MCV: 85.3 fl (ref 78.0–100.0)
MONO ABS: 0.6 10*3/uL (ref 0.1–1.0)
Monocytes Relative: 9 % (ref 3.0–12.0)
Neutro Abs: 4.3 10*3/uL (ref 1.4–7.7)
Neutrophils Relative %: 61.1 % (ref 43.0–77.0)
Platelets: 230 10*3/uL (ref 150.0–400.0)
RBC: 5.77 Mil/uL (ref 4.22–5.81)
RDW: 14 % (ref 11.5–15.5)
WBC: 7.1 10*3/uL (ref 4.0–10.5)

## 2015-08-18 LAB — PSA: PSA: 2.32 ng/mL (ref 0.10–4.00)

## 2015-08-18 LAB — TSH: TSH: 1.11 u[IU]/mL (ref 0.35–4.50)

## 2015-08-18 LAB — LIPID PANEL
Cholesterol: 140 mg/dL (ref 0–200)
HDL: 39.8 mg/dL
LDL Cholesterol: 84 mg/dL (ref 0–99)
NonHDL: 100.04
Total CHOL/HDL Ratio: 4
Triglycerides: 82 mg/dL (ref 0.0–149.0)
VLDL: 16.4 mg/dL (ref 0.0–40.0)

## 2015-08-18 LAB — BASIC METABOLIC PANEL
BUN: 12 mg/dL (ref 6–23)
CALCIUM: 9 mg/dL (ref 8.4–10.5)
CHLORIDE: 103 meq/L (ref 96–112)
CO2: 28 mEq/L (ref 19–32)
CREATININE: 0.9 mg/dL (ref 0.40–1.50)
GFR: 90.44 mL/min (ref >60.00)
Glucose, Bld: 96 mg/dL (ref 70–99)
Potassium: 4.3 mEq/L (ref 3.5–5.1)
Sodium: 139 mEq/L (ref 135–145)

## 2015-08-18 LAB — HEPATIC FUNCTION PANEL
ALBUMIN: 4.1 g/dL (ref 3.5–5.2)
ALT: 23 U/L (ref 0–53)
AST: 20 U/L (ref 0–37)
Alkaline Phosphatase: 58 U/L (ref 39–117)
Bilirubin, Direct: 0.2 mg/dL (ref 0.0–0.3)
Total Bilirubin: 0.7 mg/dL (ref 0.2–1.2)
Total Protein: 6.7 g/dL (ref 6.0–8.3)

## 2015-08-18 NOTE — Progress Notes (Signed)
   Subjective:    Patient ID: Jesse [name], male    DOB: 1951/04/14, 64 y.o.   MRN: [id number]  HPI CPE- UTD on cologuard (done last year, due 2018).  No exercise other than physical demands of work.     Review of Systems Patient reports no vision/hearing changes, anorexia, fever ,adenopathy, persistant/recurrent hoarseness, swallowing issues, chest pain, palpitations, edema, persistant/recurrent cough, hemoptysis, dyspnea (rest,exertional, paroxysmal nocturnal), gastrointestinal  bleeding (melena, rectal bleeding), abdominal pain, excessive heart burn, GU symptoms (dysuria, hematuria, voiding/incontinence issues) syncope, focal weakness, memory loss, numbness & tingling, skin/hair/nail changes, depression, anxiety, abnormal bruising/bleeding, musculoskeletal symptoms/signs.     Objective:   Physical Exam BP 140/88 mmHg  Pulse 65  Temp(Src) 97.5 F (36.4 C) (Oral)  Resp 20  Ht 6' (1.829 m)  Wt 273 lb 9.6 oz (124.104 kg)  BMI 37.10 kg/m2  SpO2 98%  General Appearance:    Alert, cooperative, no distress, appears stated age, obese  Head:    Normocephalic, without obvious abnormality, atraumatic.  Cyst at R posterior hairline w/o evidence of inflammation or infxn  Eyes:    PERRL, conjunctiva/corneas clear, EOM's intact, fundi    benign, both eyes       Ears:    Normal TM's and external ear canals, both ears  Nose:   Nares normal, septum midline, mucosa normal, no drainage   or sinus tenderness  Throat:   Lips, mucosa, and tongue normal; teeth and gums normal  Neck:   Supple, symmetrical, trachea midline, no adenopathy;       thyroid:  No enlargement/tenderness/nodules  Back:     Symmetric, no curvature, ROM normal, no CVA tenderness  Lungs:     Clear to auscultation bilaterally, respirations unlabored  Chest wall:    No tenderness or deformity  Heart:    Regular rate and rhythm, S1 and S2 normal, no murmur, rub   or gallop  Abdomen:     Soft, non-tender, bowel sounds active all  four quadrants,    no masses, no organomegaly  Genitalia:    Normal male without lesion, discharge or tenderness  Rectal:    Normal tone, normal prostate, no masses or tenderness  Extremities:   Extremities normal, atraumatic, no cyanosis or edema  Pulses:   2+ and symmetric all extremities  Skin:   Skin color, texture, turgor normal, no rashes or lesions  Lymph nodes:   Cervical, supraclavicular, and axillary nodes normal  Neurologic:   CNII-XII intact. Normal strength, sensation and reflexes      throughout          Assessment & Plan:

## 2015-08-18 NOTE — Patient Instructions (Signed)
Follow up in 6 months to recheck blood pressure and cholesterol We'll notify you of your lab results and make any changes if needed Try and make healthy food choices and get regular physical activity- a nightly walk would be great! Call with any questions or concerns Happy Fall!!!

## 2015-08-18 NOTE — Progress Notes (Signed)
Pre visit review using our clinic review tool, if applicable. No additional management support is needed unless otherwise documented below in the visit note. 

## 2015-08-19 ENCOUNTER — Other Ambulatory Visit: Payer: Self-pay | Admitting: General Practice

## 2015-08-19 MED ORDER — ATORVASTATIN CALCIUM 10 MG PO TABS: 10.0000 mg | ORAL_TABLET | Freq: Every day | ORAL | Status: DC

## 2015-09-09 ENCOUNTER — Encounter: Payer: Self-pay | Admitting: Pulmonary Disease

## 2015-09-16 NOTE — Assessment & Plan Note (Addendum)
Pt's PE unchanged from previous.  Stressed need for healthy diet and regular exercise to combat his obesity.  UTD on cologuard- due to repeat in 2 yrs.  Check labs.  Anticipatory guidance provided.

## 2015-12-30 ENCOUNTER — Encounter: Payer: Self-pay | Admitting: General Practice

## 2016-02-16 ENCOUNTER — Ambulatory Visit: Payer: BLUE CROSS/BLUE SHIELD | Admitting: Family Medicine

## 2016-03-10 ENCOUNTER — Encounter: Payer: Self-pay | Admitting: Internal Medicine

## 2016-03-10 ENCOUNTER — Ambulatory Visit (INDEPENDENT_AMBULATORY_CARE_PROVIDER_SITE_OTHER): Payer: BLUE CROSS/BLUE SHIELD | Admitting: Internal Medicine

## 2016-03-10 ENCOUNTER — Other Ambulatory Visit (INDEPENDENT_AMBULATORY_CARE_PROVIDER_SITE_OTHER): Payer: BLUE CROSS/BLUE SHIELD

## 2016-03-10 VITALS — BP 128/80 | HR 73 | Temp 97.6°F | Ht 72.0 in | Wt 270.0 lb

## 2016-03-10 DIAGNOSIS — IMO0001 Reserved for inherently not codable concepts without codable children: Secondary | ICD-10-CM

## 2016-03-10 DIAGNOSIS — E669 Obesity, unspecified: Secondary | ICD-10-CM

## 2016-03-10 DIAGNOSIS — I10 Essential (primary) hypertension: Secondary | ICD-10-CM

## 2016-03-10 DIAGNOSIS — R030 Elevated blood-pressure reading, without diagnosis of hypertension: Secondary | ICD-10-CM | POA: Diagnosis not present

## 2016-03-10 LAB — BASIC METABOLIC PANEL
BUN: 12 mg/dL (ref 6–23)
CHLORIDE: 101 meq/L (ref 96–112)
CO2: 31 meq/L (ref 19–32)
CREATININE: 0.96 mg/dL (ref 0.40–1.50)
Calcium: 9.5 mg/dL (ref 8.4–10.5)
GFR: 83.8 mL/min (ref >60.00)
Glucose, Bld: 106 mg/dL — ABNORMAL HIGH (ref 70–99)
Potassium: 4.4 mEq/L (ref 3.5–5.1)
SODIUM: 138 meq/L (ref 135–145)

## 2016-03-10 MED ORDER — ATORVASTATIN CALCIUM 10 MG PO TABS: 10.0000 mg | ORAL_TABLET | Freq: Every day | ORAL | Status: DC

## 2016-03-10 NOTE — Patient Instructions (Signed)
We will check the labs today and send results on mychart.   Work on drinking more water to help your stomach and working on fiber intake.   We will see you back in the fall for your physical. Please feel free to call us with problems or questions.   High-Fiber Diet Fiber, also called dietary fiber, is a type of carbohydrate found in fruits, vegetables, whole grains, and beans. A high-fiber diet can have many health benefits. Your health care provider may recommend a high-fiber diet to help:  Prevent constipation. Fiber can make your bowel movements more regular.  Lower your cholesterol.  Relieve hemorrhoids, uncomplicated diverticulosis, or irritable bowel syndrome.  Prevent overeating as part of a weight-loss plan.  Prevent heart disease, type 2 diabetes, and certain cancers. WHAT IS MY PLAN? The recommended daily intake of fiber includes:  38 grams for men under age 30.  29 grams for men over age 84.  5 grams for women under age 51.  79 grams for women over age 60. You can get the recommended daily intake of dietary fiber by eating a variety of fruits, vegetables, grains, and beans. Your health care provider may also recommend a fiber supplement if it is not possible to get enough fiber through your diet. WHAT DO I NEED TO KNOW ABOUT A HIGH-FIBER DIET?  Fiber supplements have not been widely studied for their effectiveness, so it is better to get fiber through food sources.  Always check the fiber content on thenutrition facts label of any prepackaged food. Look for foods that contain at least 5 grams of fiber per serving.  Ask your dietitian if you have questions about specific foods that are related to your condition, especially if those foods are not listed in the following section.  Increase your daily fiber consumption gradually. Increasing your intake of dietary fiber too quickly may cause bloating, cramping, or gas.  Drink plenty of water. Water helps you to digest  fiber. WHAT FOODS CAN I EAT? Grains Whole-grain breads. Multigrain cereal. Oats and oatmeal. Brown rice. Barley. Bulgur wheat. [place]. Bran muffins. Popcorn. Rye wafer crackers. Vegetables Sweet potatoes. Spinach. Kale. Artichokes. Cabbage. Broccoli. Green peas. Carrots. Squash. Fruits Berries. Pears. Apples. Oranges. Avocados. Prunes and raisins. Dried figs. Meats and Other Protein Sources [place], kidney, pinto, and soy beans. Split peas. Lentils. Nuts and seeds. Dairy Fiber-fortified yogurt. Beverages Fiber-fortified soy milk. Fiber-fortified orange juice. Other Fiber bars. The items listed above may not be a complete list of recommended foods or beverages. Contact your dietitian for more options. WHAT FOODS ARE NOT RECOMMENDED? Grains White bread. Pasta made with refined flour. White rice. Vegetables Fried potatoes. Canned vegetables. Well-cooked vegetables.  Fruits Fruit juice. Cooked, strained fruit. Meats and Other Protein Sources Fatty cuts of meat. Fried Sales executive or fried fish. Dairy Milk. Yogurt. Cream cheese. Sour cream. Beverages Soft drinks. Other Cakes and pastries. Butter and oils. The items listed above may not be a complete list of foods and beverages to avoid. Contact your dietitian for more information. WHAT ARE SOME TIPS FOR INCLUDING HIGH-FIBER FOODS IN MY DIET?  Eat a wide variety of high-fiber foods.  Make sure that half of all grains consumed each day are whole grains.  Replace breads and cereals made from refined flour or white flour with whole-grain breads and cereals.  Replace white rice with brown rice, bulgur wheat, or millet.  Start the day with a breakfast that is high in fiber, such as a cereal that contains at least  5 grams of fiber per serving.  Use beans in place of meat in soups, salads, or pasta.  Eat high-fiber snacks, such as berries, raw vegetables, nuts, or popcorn.   This information is not intended to replace advice given to you  by your health care provider. Make sure you discuss any questions you have with your health care provider.   Document Released: 11/01/2005 Document Revised: 11/22/2014 Document Reviewed: 04/16/2014 Elsevier Interactive Patient Education [employer].

## 2016-03-10 NOTE — Progress Notes (Signed)
Pre visit review using our clinic review tool, if applicable. No additional management support is needed unless otherwise documented below in the visit note. 

## 2016-03-12 ENCOUNTER — Encounter: Payer: Self-pay | Admitting: Internal Medicine

## 2016-03-13 DIAGNOSIS — E669 Obesity, unspecified: Secondary | ICD-10-CM | POA: Insufficient documentation

## 2016-03-13 NOTE — Assessment & Plan Note (Signed)
BP is normal today and talked more to him about low sodium and he needs to increase his exercise to help avoid medication in the future.

## 2016-03-13 NOTE — Assessment & Plan Note (Signed)
Talked to him about the need for exercising for health to keep from getting complications. Does have OSA which is untreated.

## 2016-03-13 NOTE — Progress Notes (Signed)
   Subjective:    Patient ID: Jesse [name], male    DOB: 02-Apr-1951, 65 y.o.   MRN: [id number]  HPI The patient is a 65 YO man coming in for follow up of elevated BPs. He has not been diagnosed with hypertension in the past. Does not take medicines. Not exercising much right now but has been watching his diet some. Still taking his cholesterol medicine without side effects.   Review of Systems  Constitutional: Negative for fever, activity change, appetite change, fatigue and unexpected weight change.  HENT: Negative.   Respiratory: Negative for cough, chest tightness, shortness of breath and wheezing.   Cardiovascular: Negative for chest pain, palpitations and leg swelling.  Gastrointestinal: Negative for nausea, abdominal pain, diarrhea, constipation and abdominal distention.  Musculoskeletal: Negative.   Skin: Negative.   Neurological: Negative.   Psychiatric/Behavioral: Negative.      Objective:   Physical Exam  Constitutional: He is oriented to person, place, and time. He appears well-developed and well-nourished.  HENT:  Head: Normocephalic and atraumatic.  Eyes: EOM are normal.  Neck: Normal range of motion.  Cardiovascular: Normal rate and regular rhythm.   Pulmonary/Chest: Effort normal and breath sounds normal. No respiratory distress. He has no wheezes. He has no rales.  Abdominal: Soft. Bowel sounds are normal. He exhibits no distension. There is no tenderness. There is no rebound.  Neurological: He is alert and oriented to person, place, and time.  Skin: Skin is warm and dry.  Psychiatric: He has a normal mood and affect.   Filed Vitals:   03/10/16 0806  BP: 128/80  Pulse: 73  Temp: 97.6 F (36.4 C)  TempSrc: Oral  Height: 6' (1.829 m)  Weight: 270 lb (122.471 kg)  SpO2: 95%      Assessment & Plan:

## 2016-08-16 ENCOUNTER — Ambulatory Visit: Payer: BLUE CROSS/BLUE SHIELD | Admitting: Pulmonary Disease

## 2016-08-16 ENCOUNTER — Encounter: Payer: Self-pay | Admitting: Pulmonary Disease

## 2016-08-16 ENCOUNTER — Ambulatory Visit (INDEPENDENT_AMBULATORY_CARE_PROVIDER_SITE_OTHER): Payer: BLUE CROSS/BLUE SHIELD | Admitting: Pulmonary Disease

## 2016-08-16 DIAGNOSIS — G4733 Obstructive sleep apnea (adult) (pediatric): Secondary | ICD-10-CM

## 2016-08-16 NOTE — Patient Instructions (Signed)
CPAP is set at 18 cm and is working well

## 2016-08-16 NOTE — Assessment & Plan Note (Signed)
Controlled.  

## 2016-08-16 NOTE — Assessment & Plan Note (Signed)
Good control of events and 18 cm  Weight loss encouraged, compliance with goal of at least 4-6 hrs every night is the expectation. Advised against medications with sedative side effects Cautioned against driving when sleepy - understanding that sleepiness will vary on a day to day basis

## 2016-08-16 NOTE — Progress Notes (Signed)
   Subjective:    Patient ID: Jesse [name], male    DOB: 06/21/1951, 65 y.o.   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 Follow-up    Doing well on CPAP.  no concerns.   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  Review of Systems neg for any significant sore throat, dysphagia, itching, sneezing, nasal congestion or excess/ purulent secretions, fever, chills, sweats, unintended wt loss, pleuritic or exertional cp, hempoptysis, orthopnea pnd or change in chronic leg swelling.   Also denies presyncope, palpitations, heartburn, abdominal pain, nausea, vomiting, diarrhea or change in bowel or urinary habits, dysuria,hematuria, rash, arthralgias, visual complaints, headache, numbness weakness or ataxia.     Objective:   Physical Exam  Gen. Pleasant, obese, in no distress ENT - no lesions, no post nasal drip Neck: No JVD, no thyromegaly, no carotid bruits Lungs: no use of accessory muscles, no dullness to percussion, decreased without rales or rhonchi  Cardiovascular: Rhythm regular, heart sounds  normal, no murmurs or gallops, no peripheral edema Musculoskeletal: No deformities, no cyanosis or clubbing , no tremors       Assessment & Plan:

## 2016-08-17 ENCOUNTER — Ambulatory Visit (INDEPENDENT_AMBULATORY_CARE_PROVIDER_SITE_OTHER): Payer: BLUE CROSS/BLUE SHIELD | Admitting: Internal Medicine

## 2016-08-17 ENCOUNTER — Encounter: Payer: Self-pay | Admitting: Internal Medicine

## 2016-08-17 ENCOUNTER — Other Ambulatory Visit (INDEPENDENT_AMBULATORY_CARE_PROVIDER_SITE_OTHER): Payer: BLUE CROSS/BLUE SHIELD

## 2016-08-17 VITALS — BP 118/80 | HR 74 | Temp 98.5°F | Resp 14 | Ht 72.0 in | Wt 262.0 lb

## 2016-08-17 DIAGNOSIS — Z0000 Encounter for general adult medical examination without abnormal findings: Secondary | ICD-10-CM | POA: Diagnosis not present

## 2016-08-17 DIAGNOSIS — E669 Obesity, unspecified: Secondary | ICD-10-CM

## 2016-08-17 DIAGNOSIS — E785 Hyperlipidemia, unspecified: Secondary | ICD-10-CM

## 2016-08-17 DIAGNOSIS — Z23 Encounter for immunization: Secondary | ICD-10-CM

## 2016-08-17 DIAGNOSIS — IMO0001 Reserved for inherently not codable concepts without codable children: Secondary | ICD-10-CM

## 2016-08-17 DIAGNOSIS — Z1159 Encounter for screening for other viral diseases: Secondary | ICD-10-CM

## 2016-08-17 DIAGNOSIS — Z6835 Body mass index (BMI) 35.0-35.9, adult: Secondary | ICD-10-CM

## 2016-08-17 LAB — LIPID PANEL
CHOL/HDL RATIO: 4
CHOLESTEROL: 202 mg/dL — AB (ref 0–200)
HDL: 45.2 mg/dL (ref >39.00)
LDL CALC: 135 mg/dL — AB (ref 0–99)
NonHDL: 157.27
TRIGLYCERIDES: 111 mg/dL (ref 0.0–149.0)
VLDL: 22.2 mg/dL (ref 0.0–40.0)

## 2016-08-17 LAB — HEMOGLOBIN A1C: Hgb A1c MFr Bld: 6 % (ref 4.6–6.5)

## 2016-08-17 LAB — COMPREHENSIVE METABOLIC PANEL
ALBUMIN: 4.2 g/dL (ref 3.5–5.2)
ALT: 17 U/L (ref 0–53)
AST: 16 U/L (ref 0–37)
Alkaline Phosphatase: 51 U/L (ref 39–117)
BUN: 13 mg/dL (ref 6–23)
CALCIUM: 9.1 mg/dL (ref 8.4–10.5)
CHLORIDE: 103 meq/L (ref 96–112)
CO2: 29 meq/L (ref 19–32)
Creatinine, Ser: 0.95 mg/dL (ref 0.40–1.50)
GFR: 84.7 mL/min (ref >60.00)
Glucose, Bld: 100 mg/dL — ABNORMAL HIGH (ref 70–99)
POTASSIUM: 4.4 meq/L (ref 3.5–5.1)
SODIUM: 140 meq/L (ref 135–145)
Total Bilirubin: 0.7 mg/dL (ref 0.2–1.2)
Total Protein: 7.5 g/dL (ref 6.0–8.3)

## 2016-08-17 LAB — CBC
HEMATOCRIT: 47.7 % (ref 39.0–52.0)
Hemoglobin: 16.3 g/dL (ref 13.0–17.0)
MCHC: 34.1 g/dL (ref 30.0–36.0)
MCV: 83 fl (ref 78.0–100.0)
PLATELETS: 237 10*3/uL (ref 150.0–400.0)
RBC: 5.75 Mil/uL (ref 4.22–5.81)
RDW: 14.2 % (ref 11.5–15.5)
WBC: 7.7 10*3/uL (ref 4.0–10.5)

## 2016-08-17 NOTE — Assessment & Plan Note (Signed)
Was taking lipitor but stopped due to perception of gas. Checking lipid panel and adjust as needed. Goal LDL <130.

## 2016-08-17 NOTE — Addendum Note (Signed)
Addended by: Resa Miner R on: 08/17/2016 10:12 AM   Modules accepted: Orders

## 2016-08-17 NOTE — Progress Notes (Signed)
Pre visit review using our clinic review tool, if applicable. No additional management support is needed unless otherwise documented below in the visit note. 

## 2016-08-17 NOTE — Assessment & Plan Note (Addendum)
Talked to him about diet and exercise for weight loss. Not exercising now. Complicated by OSA.

## 2016-08-17 NOTE — Progress Notes (Signed)
   Subjective:    Patient ID: Jesse [name], male    DOB: [birth year], 65 y.o.   MRN: [id number]  HPI The patient is a 65 YO man coming in for wellness. No new concerns.   PMH, [facility], social history reviewed and updated.   Review of Systems  Constitutional: Negative for activity change, appetite change, fatigue, fever and unexpected weight change.  HENT: Positive for hearing loss.   Eyes: Negative.   Respiratory: Negative for cough, chest tightness, shortness of breath and wheezing.   Cardiovascular: Negative for chest pain, palpitations and leg swelling.  Gastrointestinal: Negative for abdominal distention, abdominal pain, constipation, diarrhea and nausea.  Musculoskeletal: Positive for arthralgias. Negative for back pain, gait problem and myalgias.  Skin: Negative.   Neurological: Negative.   Psychiatric/Behavioral: Negative.       Objective:   Physical Exam  Constitutional: He is oriented to person, place, and time. He appears well-developed and well-nourished.  HENT:  Head: Normocephalic and atraumatic.  Right Ear: External ear normal.  Left Ear: External ear normal.  Eyes: EOM are normal.  Neck: Normal range of motion.  Cardiovascular: Normal rate and regular rhythm.   No murmur heard. Pulmonary/Chest: Effort normal and breath sounds normal. No respiratory distress. He has no wheezes. He has no rales.  Abdominal: Soft. Bowel sounds are normal. He exhibits no distension. There is no tenderness. There is no rebound.  Musculoskeletal: He exhibits no edema.  Neurological: He is alert and oriented to person, place, and time.  Skin: Skin is warm and dry.  Psychiatric: He has a normal mood and affect.   Vitals:   08/17/16 0803  BP: 118/80  Pulse: 74  Resp: 14  Temp: 98.5 F (36.9 C)  TempSrc: Oral  SpO2: 96%  Weight: 262 lb (118.8 kg)  Height: 6' (1.829 m)      Assessment & Plan:  Tdap and flu shot given at visit.

## 2016-08-17 NOTE — Patient Instructions (Signed)
We have given you the tetanus and the flu shot today.  We are checking the labs and will call you back with the results.   Health Maintenance, Male A healthy lifestyle and preventative care can promote health and wellness.  Maintain regular health, dental, and eye exams.  Eat a healthy diet. Foods like vegetables, fruits, whole grains, low-fat dairy products, and lean protein foods contain the nutrients you need and are low in calories. Decrease your intake of foods high in solid fats, added sugars, and salt. Get information about a proper diet from your health care provider, if necessary.  Regular physical exercise is one of the most important things you can do for your health. Most adults should get at least 150 minutes of moderate-intensity exercise (any activity that increases your heart rate and causes you to sweat) each week. In addition, most adults need muscle-strengthening exercises on 2 or more days a week.   Maintain a healthy weight. The body mass index (BMI) is a screening tool to identify possible weight problems. It provides an estimate of body fat based on height and weight. Your health care provider can find your BMI and can help you achieve or maintain a healthy weight. For males 20 years and older:  A BMI below 18.5 is considered underweight.  A BMI of 18.5 to 24.9 is normal.  A BMI of 25 to 29.9 is considered overweight.  A BMI of 30 and above is considered obese.  Maintain normal blood lipids and cholesterol by exercising and minimizing your intake of saturated fat. Eat a balanced diet with plenty of fruits and vegetables. Blood tests for lipids and cholesterol should begin at age 22 and be repeated every 5 years. If your lipid or cholesterol levels are high, you are over age 61, or you are at high risk for heart disease, you may need your cholesterol levels checked more frequently.Ongoing high lipid and cholesterol levels should be treated with medicines if diet and  exercise are not working.  If you smoke, find out from your health care provider how to quit. If you do not use tobacco, do not start.  Lung cancer screening is recommended for adults aged 78-80 years who are at high risk for developing lung cancer because of a history of smoking. A yearly low-dose CT scan of the lungs is recommended for people who have at least a 30-pack-year history of smoking and are current smokers or have quit within the past 15 years. A pack year of smoking is smoking an average of 1 pack of cigarettes a day for 1 year (for example, a 30-pack-year history of smoking could mean smoking 1 pack a day for 30 years or 2 packs a day for 15 years). Yearly screening should continue until the smoker has stopped smoking for at least 15 years. Yearly screening should be stopped for people who develop a health problem that would prevent them from having lung cancer treatment.  If you choose to drink alcohol, do not have more than 2 drinks per day. One drink is considered to be 12 oz (360 mL) of beer, 5 oz (150 mL) of wine, or 1.5 oz (45 mL) of liquor.  Avoid the use of street drugs. Do not share needles with anyone. Ask for help if you need support or instructions about stopping the use of drugs.  High blood pressure causes heart disease and increases the risk of stroke. High blood pressure is more likely to develop in:  People who  have blood pressure in the end of the normal range (100-139/85-89 mm Hg).  People who are overweight or obese.  People who are African American.  If you are 53-65 years of age, have your blood pressure checked every 3-5 years. If you are 17 years of age or older, have your blood pressure checked every year. You should have your blood pressure measured twice--once when you are at a hospital or clinic, and once when you are not at a hospital or clinic. Record the average of the two measurements. To check your blood pressure when you are not at a hospital or  clinic, you can use:  An automated blood pressure machine at a pharmacy.  A home blood pressure monitor.  If you are 26-18 years old, ask your health care provider if you should take aspirin to prevent heart disease.  Diabetes screening involves taking a blood sample to check your fasting blood sugar level. This should be done once every 3 years after age 67 if you are at a normal weight and without risk factors for diabetes. Testing should be considered at a younger age or be carried out more frequently if you are overweight and have at least 1 risk factor for diabetes.  Colorectal cancer can be detected and often prevented. Most routine colorectal cancer screening begins at the age of 88 and continues through age 18. However, your health care provider may recommend screening at an earlier age if you have risk factors for colon cancer. On a yearly basis, your health care provider may provide home test kits to check for hidden blood in the stool. A small camera at the end of a tube may be used to directly examine the colon (sigmoidoscopy or colonoscopy) to detect the earliest forms of colorectal cancer. Talk to your health care provider about this at age 29 when routine screening begins. A direct exam of the colon should be repeated every 5-10 years through age 67, unless early forms of precancerous polyps or small growths are found.  People who are at an increased risk for hepatitis B should be screened for this virus. You are considered at high risk for hepatitis B if:  You were born in a country where hepatitis B occurs often. Talk with your health care provider about which countries are considered high risk.  Your parents were born in a high-risk country and you have not received a shot to protect against hepatitis B (hepatitis B vaccine).  You have HIV or AIDS.  You use needles to inject street drugs.  You live with, or have sex with, someone who has hepatitis B.  You are a man who has  sex with other men (MSM).  You get hemodialysis treatment.  You take certain medicines for conditions like cancer, organ transplantation, and autoimmune conditions.  Hepatitis C blood testing is recommended for all people born from [birth year] through 1964 and any individual with known risk factors for hepatitis C.  Healthy men should no longer receive prostate-specific antigen (PSA) blood tests as part of routine cancer screening. Talk to your health care provider about prostate cancer screening.  Testicular cancer screening is not recommended for adolescents or adult males who have no symptoms. Screening includes self-exam, a health care provider exam, and other screening tests. Consult with your health care provider about any symptoms you have or any concerns you have about testicular cancer.  Practice safe sex. Use condoms and avoid high-risk sexual practices to reduce the spread of sexually transmitted  infections (STIs).  You should be screened for STIs, including gonorrhea and chlamydia if:  You are sexually active and are younger than 24 years.  You are older than 24 years, and your health care provider tells you that you are at risk for this type of infection.  Your sexual activity has changed since you were last screened, and you are at an increased risk for chlamydia or gonorrhea. Ask your health care provider if you are at risk.  If you are at risk of being infected with HIV, it is recommended that you take a prescription medicine daily to prevent HIV infection. This is called pre-exposure prophylaxis (PrEP). You are considered at risk if:  You are a man who has sex with other men (MSM).  You are a heterosexual man who is sexually active with multiple partners.  You take drugs by injection.  You are sexually active with a partner who has HIV.  Talk with your health care provider about whether you are at high risk of being infected with HIV. If you choose to begin PrEP, you should  first be tested for HIV. You should then be tested every 3 months for as long as you are taking PrEP.  Use sunscreen. Apply sunscreen liberally and repeatedly throughout the day. You should seek shade when your shadow is shorter than you. Protect yourself by wearing long sleeves, pants, a wide-brimmed hat, and sunglasses year round whenever you are outdoors.  Tell your health care provider of new moles or changes in moles, especially if there is a change in shape or color. Also, tell your health care provider if a mole is larger than the size of a pencil eraser.  A one-time screening for abdominal aortic aneurysm (AAA) and surgical repair of large AAAs by ultrasound is recommended for men aged 50-75 years who are current or former smokers.  Stay current with your vaccines (immunizations).   This information is not intended to replace advice given to you by your health care provider. Make sure you discuss any questions you have with your health care provider.   Document Released: 04/29/2008 Document Revised: 11/22/2014 Document Reviewed: 03/29/2011 Elsevier Interactive Patient Education [employer].

## 2016-08-17 NOTE — Assessment & Plan Note (Signed)
Given tdap and flu shot to update the immunizations, declines shingles today. Declines HIV screening but screening for hepatitis C. Colonoscopy up to date. Counseled on sun safety and mole surveillance.

## 2016-08-18 LAB — HEPATITIS C ANTIBODY: HCV AB: NEGATIVE

## 2016-08-25 ENCOUNTER — Encounter: Payer: Self-pay | Admitting: Pulmonary Disease

## 2016-11-15 HISTORY — PX: FINGER SURGERY: SHX640

## 2017-08-22 ENCOUNTER — Ambulatory Visit (INDEPENDENT_AMBULATORY_CARE_PROVIDER_SITE_OTHER): Payer: BLUE CROSS/BLUE SHIELD | Admitting: Internal Medicine

## 2017-08-22 ENCOUNTER — Encounter: Payer: Self-pay | Admitting: Internal Medicine

## 2017-08-22 ENCOUNTER — Other Ambulatory Visit (INDEPENDENT_AMBULATORY_CARE_PROVIDER_SITE_OTHER): Payer: BLUE CROSS/BLUE SHIELD

## 2017-08-22 VITALS — BP 128/84 | HR 63 | Temp 97.7°F | Ht 72.0 in | Wt 272.0 lb

## 2017-08-22 DIAGNOSIS — R030 Elevated blood-pressure reading, without diagnosis of hypertension: Secondary | ICD-10-CM

## 2017-08-22 DIAGNOSIS — Z0000 Encounter for general adult medical examination without abnormal findings: Secondary | ICD-10-CM | POA: Diagnosis not present

## 2017-08-22 DIAGNOSIS — E785 Hyperlipidemia, unspecified: Secondary | ICD-10-CM

## 2017-08-22 DIAGNOSIS — Z23 Encounter for immunization: Secondary | ICD-10-CM

## 2017-08-22 DIAGNOSIS — Z6836 Body mass index (BMI) 36.0-36.9, adult: Secondary | ICD-10-CM

## 2017-08-22 LAB — COMPREHENSIVE METABOLIC PANEL
ALT: 21 U/L (ref 0–53)
AST: 17 U/L (ref 0–37)
Albumin: 4.3 g/dL (ref 3.5–5.2)
Alkaline Phosphatase: 55 U/L (ref 39–117)
BUN: 12 mg/dL (ref 6–23)
CHLORIDE: 102 meq/L (ref 96–112)
CO2: 26 meq/L (ref 19–32)
CREATININE: 0.81 mg/dL (ref 0.40–1.50)
Calcium: 9.1 mg/dL (ref 8.4–10.5)
GFR: 101.49 mL/min (ref >60.00)
GLUCOSE: 106 mg/dL — AB (ref 70–99)
Potassium: 4.2 mEq/L (ref 3.5–5.1)
SODIUM: 136 meq/L (ref 135–145)
Total Bilirubin: 0.6 mg/dL (ref 0.2–1.2)
Total Protein: 7.2 g/dL (ref 6.0–8.3)

## 2017-08-22 LAB — CBC
HCT: 48.7 % (ref 39.0–52.0)
HEMOGLOBIN: 16 g/dL (ref 13.0–17.0)
MCHC: 32.9 g/dL (ref 30.0–36.0)
MCV: 84.7 fl (ref 78.0–100.0)
Platelets: 237 10*3/uL (ref 150.0–400.0)
RBC: 5.75 Mil/uL (ref 4.22–5.81)
RDW: 14.3 % (ref 11.5–15.5)
WBC: 6.8 10*3/uL (ref 4.0–10.5)

## 2017-08-22 LAB — HEMOGLOBIN A1C: Hgb A1c MFr Bld: 6.2 % (ref 4.6–6.5)

## 2017-08-22 LAB — LIPID PANEL
Cholesterol: 194 mg/dL (ref 0–200)
HDL: 45.9 mg/dL (ref >39.00)
LDL CALC: 124 mg/dL — AB (ref 0–99)
NONHDL: 148.3
Total CHOL/HDL Ratio: 4
Triglycerides: 124 mg/dL (ref 0.0–149.0)
VLDL: 24.8 mg/dL (ref 0.0–40.0)

## 2017-08-22 MED ORDER — BUSPIRONE HCL 5 MG PO TABS: 5.0000 mg | ORAL_TABLET | Freq: Every day | ORAL | 3 refills | Status: DC | PRN

## 2017-08-22 NOTE — Assessment & Plan Note (Signed)
BP at goal today, continue to monitor.

## 2017-08-22 NOTE — Patient Instructions (Signed)
Make sure to call the insurance company about the cologuard for screening or consider getting a colonoscopy done.  We have given you the flu shot and will put you on the list for the shingles vaccine.    Stress and Stress Management Stress is a normal reaction to life events. It is what you feel when life demands more than you are used to or more than you can handle. Some stress can be useful. For example, the stress reaction can help you catch the last bus of the day, study for a test, or meet a deadline at work. But stress that occurs too often or for too long can cause problems. It can affect your emotional health and interfere with relationships and normal daily activities. Too much stress can weaken your immune system and increase your risk for physical illness. If you already have a medical problem, stress can make it worse. What are the causes? All sorts of life events may cause stress. An event that causes stress for one person may not be stressful for another person. Major life events commonly cause stress. These may be positive or negative. Examples include losing your job, moving into a new home, getting married, having a baby, or losing a loved one. Less obvious life events may also cause stress, especially if they occur day after day or in combination. Examples include working long hours, driving in traffic, caring for children, being in debt, or being in a difficult relationship. What are the signs or symptoms? Stress may cause emotional symptoms including, the following:  Anxiety. This is feeling worried, afraid, on edge, overwhelmed, or out of control.  Anger. This is feeling irritated or impatient.  Depression. This is feeling sad, down, helpless, or guilty.  Difficulty focusing, remembering, or making decisions.  Stress may cause physical symptoms, including the following:  Aches and pains. These may affect your head, neck, back, stomach, or other areas of your body.  Tight  muscles or clenched jaw.  Low energy or trouble sleeping.  Stress may cause unhealthy behaviors, including the following:  Eating to feel better (overeating) or skipping meals.  Sleeping too little, too much, or both.  Working too much or putting off tasks (procrastination).  Smoking, drinking alcohol, or using drugs to feel better.  How is this diagnosed? Stress is diagnosed through an assessment by your health care provider. Your health care provider will ask questions about your symptoms and any stressful life events.Your health care provider will also ask about your medical history and may order blood tests or other tests. Certain medical conditions and medicine can cause physical symptoms similar to stress. Mental illness can cause emotional symptoms and unhealthy behaviors similar to stress. Your health care provider may refer you to a mental health professional for further evaluation. How is this treated? Stress management is the recommended treatment for stress.The goals of stress management are reducing stressful life events and coping with stress in healthy ways. Techniques for reducing stressful life events include the following:  Stress identification. Self-monitor for stress and identify what causes stress for you. These skills may help you to avoid some stressful events.  Time management. Set your priorities, keep a calendar of events, and learn to say "no." These tools can help you avoid making too many commitments.  Techniques for coping with stress include the following:  Rethinking the problem. Try to think realistically about stressful events rather than ignoring them or overreacting. Try to find the positives in a stressful situation  rather than focusing on the negatives.  Exercise. Physical exercise can release both physical and emotional tension. The key is to find a form of exercise you enjoy and do it regularly.  Relaxation techniques. These relax the body and  mind. Examples include yoga, meditation, tai chi, biofeedback, deep breathing, progressive muscle relaxation, listening to music, being out in nature, journaling, and other hobbies. Again, the key is to find one or more that you enjoy and can do regularly.  Healthy lifestyle. Eat a balanced diet, get plenty of sleep, and do not smoke. Avoid using alcohol or drugs to relax.  Strong support network. Spend time with family, friends, or other people you enjoy being around.Express your feelings and talk things over with someone you trust.  Counseling or talktherapy with a mental health professional may be helpful if you are having difficulty managing stress on your own. Medicine is typically not recommended for the treatment of stress.Talk to your health care provider if you think you need medicine for symptoms of stress. Follow these instructions at home:  Keep all follow-up visits as directed by your health care provider.  Take all medicines as directed by your health care provider. Contact a health care provider if:  Your symptoms get worse or you start having new symptoms.  You feel overwhelmed by your problems and can no longer manage them on your own. Get help right away if:  You feel like hurting yourself or someone else. This information is not intended to replace advice given to you by your health care provider. Make sure you discuss any questions you have with your health care provider. Document Released: 04/27/2001 Document Revised: 04/08/2016 Document Reviewed: 06/26/2013 Elsevier Interactive Patient Education  2017 [employer] Maintenance, Male A healthy lifestyle and preventive care is important for your health and wellness. Ask your health care provider about what schedule of regular examinations is right for you. What should I know about weight and diet? Eat a Healthy Diet  Eat plenty of vegetables, fruits, whole grains, low-fat dairy products, and lean  protein.  Do not eat a lot of foods high in solid fats, added sugars, or salt.  Maintain a Healthy Weight Regular exercise can help you achieve or maintain a healthy weight. You should:  Do at least 150 minutes of exercise each week. The exercise should increase your heart rate and make you sweat (moderate-intensity exercise).  Do strength-training exercises at least twice a week.  Watch Your Levels of Cholesterol and Blood Lipids  Have your blood tested for lipids and cholesterol every 5 years starting at 65 years of age. If you are at high risk for heart disease, you should start having your blood tested when you are 65 years old. You may need to have your cholesterol levels checked more often if: ? Your lipid or cholesterol levels are high. ? You are older than 65 years of age. ? You are at high risk for heart disease.  What should I know about cancer screening? Many types of cancers can be detected early and may often be prevented. Lung Cancer  You should be screened every year for lung cancer if: ? You are a current smoker who has smoked for at least 30 years. ? You are a former smoker who has quit within the past 15 years.  Talk to your health care provider about your screening options, when you should start screening, and how often you should be screened.  Colorectal Cancer  Routine colorectal  cancer screening usually begins at 65 years of age and should be repeated every 5-10 years until you are 65 years old. You may need to be screened more often if early forms of precancerous polyps or small growths are found. Your health care provider may recommend screening at an earlier age if you have risk factors for colon cancer.  Your health care provider may recommend using home test kits to check for hidden blood in the stool.  A small camera at the end of a tube can be used to examine your colon (sigmoidoscopy or colonoscopy). This checks for the earliest forms of colorectal  cancer.  Prostate and Testicular Cancer  Depending on your age and overall health, your health care provider may do certain tests to screen for prostate and testicular cancer.  Talk to your health care provider about any symptoms or concerns you have about testicular or prostate cancer.  Skin Cancer  Check your skin from head to toe regularly.  Tell your health care provider about any new moles or changes in moles, especially if: ? There is a change in a mole's size, shape, or color. ? You have a mole that is larger than a pencil eraser.  Always use sunscreen. Apply sunscreen liberally and repeat throughout the day.  Protect yourself by wearing long sleeves, pants, a wide-brimmed hat, and sunglasses when outside.  What should I know about heart disease, diabetes, and high blood pressure?  If you are 72-73 years of age, have your blood pressure checked every 3-5 years. If you are 53 years of age or older, have your blood pressure checked every year. You should have your blood pressure measured twice-once when you are at a hospital or clinic, and once when you are not at a hospital or clinic. Record the average of the two measurements. To check your blood pressure when you are not at a hospital or clinic, you can use: ? An automated blood pressure machine at a pharmacy. ? A home blood pressure monitor.  Talk to your health care provider about your target blood pressure.  If you are between 23-41 years old, ask your health care provider if you should take aspirin to prevent heart disease.  Have regular diabetes screenings by checking your fasting blood sugar level. ? If you are at a normal weight and have a low risk for diabetes, have this test once every three years after the age of 47. ? If you are overweight and have a high risk for diabetes, consider being tested at a younger age or more often.  A one-time screening for abdominal aortic aneurysm (AAA) by ultrasound is recommended  for men aged 45-75 years who are current or former smokers. What should I know about preventing infection? Hepatitis B If you have a higher risk for hepatitis B, you should be screened for this virus. Talk with your health care provider to find out if you are at risk for hepatitis B infection. Hepatitis C Blood testing is recommended for:  Everyone born from [birth year] through 1964.  Anyone with known risk factors for hepatitis C.  Sexually Transmitted Diseases (STDs)  You should be screened each year for STDs including gonorrhea and chlamydia if: ? You are sexually active and are younger than 65 years of age. ? You are older than 65 years of age and your health care provider tells you that you are at risk for this type of infection. ? Your sexual activity has changed since you were last  screened and you are at an increased risk for chlamydia or gonorrhea. Ask your health care provider if you are at risk.  Talk with your health care provider about whether you are at high risk of being infected with HIV. Your health care provider may recommend a prescription medicine to help prevent HIV infection.  What else can I do?  Schedule regular health, dental, and eye exams.  Stay current with your vaccines (immunizations).  Do not use any tobacco products, such as cigarettes, chewing tobacco, and e-cigarettes. If you need help quitting, ask your health care provider.  Limit alcohol intake to no more than 2 drinks per day. One drink equals 12 ounces of beer, 5 ounces of wine, or 1 ounces of hard liquor.  Do not use street drugs.  Do not share needles.  Ask your health care provider for help if you need support or information about quitting drugs.  Tell your health care provider if you often feel depressed.  Tell your health care provider if you have ever been abused or do not feel safe at home. This information is not intended to replace advice given to you by your health care provider. Make  sure you discuss any questions you have with your health care provider. Document Released: 04/29/2008 Document Revised: 06/30/2016 Document Reviewed: 08/05/2015 Elsevier Interactive Patient Education  [employer].

## 2017-08-22 NOTE — Assessment & Plan Note (Signed)
Given flu shot at visit, counseled about shingrix vaccine. Colonoscopy due and he would like to consider cologuard if covered by insurance. Needs pneumonia vaccine and he will consider. Given screening recommendations. Counseled on sun safety and mole surveillance.

## 2017-08-22 NOTE — Assessment & Plan Note (Signed)
Complicated by hyperlipidemia and OSA. BMI 36.

## 2017-08-22 NOTE — Assessment & Plan Note (Signed)
Not currently taking his lipitor.Checking lipid panel and adjust for goal LDL<130.

## 2017-08-22 NOTE — Progress Notes (Signed)
   Subjective:    Patient ID: Jesse [name], male    DOB: [birth year], 66 y.o.   MRN: [id number]  HPI The patient is a 66 YO man coming in for physical.   PMH, [facility], social history reviewed and updated.  Review of Systems  Constitutional: Negative.   HENT: Negative.   Eyes: Negative.   Respiratory: Negative for cough, chest tightness and shortness of breath.   Cardiovascular: Negative for chest pain, palpitations and leg swelling.  Gastrointestinal: Negative for abdominal distention, abdominal pain, constipation, diarrhea, nausea and vomiting.  Musculoskeletal: Negative.   Skin: Negative.   Neurological: Negative.   Psychiatric/Behavioral: Negative.       Objective:   Physical Exam  Constitutional: He is oriented to person, place, and time. He appears well-developed and well-nourished.  Obese  HENT:  Head: Normocephalic and atraumatic.  Eyes: EOM are normal.  Neck: Normal range of motion.  Cardiovascular: Normal rate and regular rhythm.   Pulmonary/Chest: Effort normal and breath sounds normal. No respiratory distress. He has no wheezes. He has no rales.  Abdominal: Soft. Bowel sounds are normal. He exhibits no distension. There is no tenderness. There is no rebound.  Musculoskeletal: He exhibits no edema.  Neurological: He is alert and oriented to person, place, and time. Coordination normal.  Skin: Skin is warm and dry.  Psychiatric: He has a normal mood and affect.   Vitals:   08/22/17 0803  BP: 128/84  Pulse: 63  Temp: 97.7 F (36.5 C)  TempSrc: Oral  SpO2: 98%  Weight: 272 lb (123.4 kg)  Height: 6' (1.829 m)      Assessment & Plan:  Flu shot given at visit.

## 2017-09-05 ENCOUNTER — Ambulatory Visit: Payer: BLUE CROSS/BLUE SHIELD | Admitting: Pulmonary Disease

## 2017-09-08 ENCOUNTER — Telehealth: Payer: Self-pay

## 2017-09-08 NOTE — Telephone Encounter (Signed)
Left message asking patient to call back and talk with tamara about getting shingrix vaccine---I need to ask a few questions before he is scheduled---

## 2017-09-12 ENCOUNTER — Ambulatory Visit (INDEPENDENT_AMBULATORY_CARE_PROVIDER_SITE_OTHER): Payer: BLUE CROSS/BLUE SHIELD | Admitting: Pulmonary Disease

## 2017-09-12 ENCOUNTER — Encounter: Payer: Self-pay | Admitting: Pulmonary Disease

## 2017-09-12 DIAGNOSIS — G4733 Obstructive sleep apnea (adult) (pediatric): Secondary | ICD-10-CM

## 2017-09-12 NOTE — Assessment & Plan Note (Signed)
He has gained 10 pounds since last year and probably this accounts with increase in pressure. Weight loss again encouraged

## 2017-09-12 NOTE — Progress Notes (Signed)
   Subjective:    Patient ID: Jesse [name], male    DOB: January 06, 1951, 66 y.o.   MRN: [id number]  HPI   61 for FU of OSA -on CPAP 18 cm, FF mask   Chief Complaint  Patient presents with  . Follow-up    1 yr f/u for OSA. Had to adjust pressure during the spring.     his CPAP was set at 18 cm.  During the spring he developed increased snoring And felt like he was having increasing gasping episodes and is sleepy.  He changed settings by himself to auto settings 18-19 cm Since then he feels better, denies any leaking from his full facemask No mask or pressure issues now. His weight is increased by 10 pounds.  Download on auto settings shows average pressure of 19 cm with good control of events and good compliance with minimal leak   Significant tests/ events reviewed   NPSG 07/2013: AHI 67/hr Auto 10/2013: Optimal pressure 18-19 with mild breakthru events.      Review of Systems neg for any significant sore throat, dysphagia, itching, sneezing, nasal congestion or excess/ purulent secretions, fever, chills, sweats, unintended wt loss, pleuritic or exertional cp, hempoptysis, orthopnea pnd or change in chronic leg swelling.  Also denies presyncope, palpitations, heartburn, abdominal pain, nausea, vomiting, diarrhea or change in bowel or urinary habits, dysuria,hematuria, rash, arthralgias, visual complaints, headache, numbness weakness or ataxia.     Objective:   Physical Exam   Gen. Pleasant, obese, in no distress ENT - no lesions, no post nasal drip Neck: No JVD, no thyromegaly, no carotid bruits Lungs: no use of accessory muscles, no dullness to percussion, decreased without rales or rhonchi  Cardiovascular: Rhythm regular, heart sounds  normal, no murmurs or gallops, no peripheral edema Musculoskeletal: No deformities, no cyanosis or clubbing , no tremors        Assessment & Plan:

## 2017-09-12 NOTE — Assessment & Plan Note (Signed)
CPAP supplies will be renewed for a year You are on auto settings 18-19 cm.  Weight loss encouraged, compliance with goal of at least 4-6 hrs every night is the expectation. Advised against medications with sedative side effects Cautioned against driving when sleepy - understanding that sleepiness will vary on a day to day basis

## 2017-09-12 NOTE — Patient Instructions (Signed)
CPAP supplies will be renewed for a year You are on auto settings 18-19 cm. Call us as needed

## 2017-09-16 ENCOUNTER — Emergency Department (HOSPITAL_COMMUNITY): Payer: Worker's Compensation

## 2017-09-16 ENCOUNTER — Emergency Department (HOSPITAL_COMMUNITY)
Admission: EM | Admit: 2017-09-16 | Discharge: 2017-09-16 | Disposition: A | Discharge status: Home / Self Care | Payer: Worker's Compensation | Attending: Emergency Medicine | Admitting: Emergency Medicine

## 2017-09-16 ENCOUNTER — Encounter (HOSPITAL_COMMUNITY): Payer: Self-pay | Admitting: Emergency Medicine

## 2017-09-16 DIAGNOSIS — Y939 Activity, unspecified: Secondary | ICD-10-CM | POA: Insufficient documentation

## 2017-09-16 DIAGNOSIS — Y990 Civilian activity done for income or pay: Secondary | ICD-10-CM | POA: Insufficient documentation

## 2017-09-16 DIAGNOSIS — Z87891 Personal history of nicotine dependence: Secondary | ICD-10-CM | POA: Diagnosis not present

## 2017-09-16 DIAGNOSIS — S61221A Laceration with foreign body of left index finger without damage to nail, initial encounter: Secondary | ICD-10-CM | POA: Diagnosis not present

## 2017-09-16 DIAGNOSIS — S61211A Laceration without foreign body of left index finger without damage to nail, initial encounter: Secondary | ICD-10-CM

## 2017-09-16 DIAGNOSIS — Z79899 Other long term (current) drug therapy: Secondary | ICD-10-CM | POA: Insufficient documentation

## 2017-09-16 DIAGNOSIS — X58XXXA Exposure to other specified factors, initial encounter: Secondary | ICD-10-CM | POA: Diagnosis not present

## 2017-09-16 DIAGNOSIS — Y929 Unspecified place or not applicable: Secondary | ICD-10-CM | POA: Insufficient documentation

## 2017-09-16 DIAGNOSIS — Z23 Encounter for immunization: Secondary | ICD-10-CM | POA: Insufficient documentation

## 2017-09-16 DIAGNOSIS — Z7982 Long term (current) use of aspirin: Secondary | ICD-10-CM | POA: Diagnosis not present

## 2017-09-16 MED ORDER — TETANUS-DIPHTH-ACELL PERTUSSIS 5-2.5-18.5 LF-MCG/0.5 IM SUSP
0.5000 mL | Freq: Once | INTRAMUSCULAR | Status: AC
  Administered 2017-09-16: 0.5 mL via INTRAMUSCULAR
  Filled 2017-09-16: qty 0.5

## 2017-09-16 MED ORDER — LIDOCAINE HCL (PF) 1 % IJ SOLN
30.0000 mL | Freq: Once | INTRAMUSCULAR | Status: DC
  Filled 2017-09-16: qty 30

## 2017-09-16 NOTE — Discharge Instructions (Signed)
You have a laceration to your left index finger that possibly involves the flexor tendon. You also reported numbness to part of your finger, so nerve injury is also possible. Please go to Dr. Vanetta Shawl office on Monday 09/19/17 at 7:30 AM for follow-up appointment. You do not have to call to confirm, he has already put you on his schedule and is expecting you. He will discuss with you  further possible treatment options. Wear your splint until you see him. I have given you note to be out of work for one week. Ask Dr. Amedeo Plenty about returning to work, he can extend your leave or give you medical clearance to return to work. For pain take 1000 mg tylenol every 8 hours for pain.

## 2017-09-16 NOTE — ED Triage Notes (Signed)
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 Patient has about 3-4 in laceration to left index finger. Finger wrapped in saline damp gauze and Coban. Patient denies taking any blood thinners. Patient reports > 10 years for tetanus.

## 2017-09-16 NOTE — ED Provider Notes (Signed)
[facility] DEPT Provider Note   CSN: [id number] Arrival date & time: 09/16/17  1439     History   Chief Complaint Chief Complaint  Patient presents with  . Laceration    HPI Jesse [name] is a 66 y.o. male presents to ED for evaluation of laceration to volar laceration of left index finger sustained while at work. Bleeding controlled. He is RHD. Last tetanus unknown. Reports numbness to radial half of index finger. No interventions PTA.   HPI  Past Medical History:  Diagnosis Date  . Arthritis   . Chicken pox   . Diverticulitis   . Hyperlipidemia   . Sleep apnea    4yr ago positive sleep test---never treated    Patient Active Problem List   Diagnosis Date Noted  . Hyperlipidemia 08/17/2016  . Obesity 03/13/2016  . Elevated blood pressure reading in office without diagnosis of hypertension 05/14/2013  . Obstructive sleep apnea 05/14/2013  . Routine general medical examination at a health care facility 02/05/2013    Past Surgical History:  Procedure Laterality Date  . CARPAL TUNNEL RELEASE  2004   right  . CATARACT EXTRACTION, BILATERAL  2014   right, 4/29; left, 3/8  . KNEE ARTHROSCOPY  2009   left  . TONSILLECTOMY  1956       Home Medications    Prior to Admission medications   Medication Sig Start Date End Date Taking? Authorizing Provider  aspirin 325 MG tablet Take 325 mg by mouth daily.    [provider]  Ibuprofen 200 MG CAPS Take 1 capsule by mouth 2 (two) times daily.    [provider]    Family History Family History  Problem Relation Age of Onset  . Cancer Mother        ovary,breast  . Alcohol abuse Father   . Heart attack Father   . Heart disease Father   . Cancer Sister        uterus  . Cancer Brother   . Diabetes Paternal Grandmother   . Heart attack Paternal Grandfather   . Hyperlipidemia Paternal Grandfather   . Colon cancer Neg Hx     Social History Social History    Substance Use Topics  . Smoking status: Former Smoker    Packs/day: 1.00    Years: 42.00    Types: Cigarettes    Quit date: 10/16/2011  . Smokeless tobacco: Never Used     Comment: SMOKED 42 YRS "OFF AND ON"  . Alcohol use Yes     Comment: occ.     Allergies   Penicillins   Review of Systems Review of Systems  All other systems reviewed and are negative.    Physical Exam Updated Vital Signs BP 120/89   Pulse 79   Temp 98 F (36.7 C) (Oral)   Resp 18   Ht 6' (1.829 m)   Wt 123.5 kg (272 lb 6 oz)   SpO2 98%   BMI 36.94 kg/m   Physical Exam  Constitutional: He is oriented to person, place, and time. He appears well-developed and well-nourished. No distress.  NAD.  HENT:  Head: Normocephalic and atraumatic.  Right Ear: External ear normal.  Left Ear: External ear normal.  Nose: Nose normal.  Eyes: Conjunctivae and EOM are normal. No scleral icterus.  Neck: Normal range of motion. Neck supple.  Cardiovascular: Normal rate, regular rhythm, normal heart sounds and intact distal pulses.   No murmur heard. Pulmonary/Chest: Effort normal and breath  sounds normal. He has no wheezes.  Musculoskeletal: Normal range of motion. He exhibits no deformity.  Full AROM of left index finger; full flexion at MCP, PIP and DIP Full left thumb opposition to all digits   Neurological: He is alert and oriented to person, place, and time.  Loss of sensation to light touch, dull/sharp sensation to radial aspect of left index finger  Skin: Skin is warm and dry. Capillary refill takes less than 2 seconds.  Straight laceration to volar aspect of left index finger from finger tip to above MCP No nail involvement  Psychiatric: He has a normal mood and affect. His behavior is normal. Judgment and thought content normal.  Nursing note and vitals reviewed.    ED Treatments / Results  Labs (all labs ordered are listed, but only abnormal results are displayed) Labs Reviewed - No data to  display  EKG  EKG Interpretation None       Radiology Dg Finger Index Left  Result Date: 09/16/2017 CLINICAL DATA:  Tight index finger with night. EXAM: LEFT INDEX FINGER 2+V COMPARISON:  None. FINDINGS: Accounting from artifact due to bandage there is no opaque foreign body or fracture. Osteochondroma incidentally noted from the third proximal phalanx along its radial and distal aspect. Bulbous appearance of the distal second metacarpal could also be from a sessile osteochondroma. IMPRESSION: 1. Negative for fracture or opaque foreign body. 2. 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 Electronically Signed   By: Monte Fantasia M.D.   On: 09/16/2017 15:28    Procedures .Marland KitchenLaceration Repair Date/Time: 09/16/2017 6:09 PM Performed by: Kinnie Feil Authorized by: Kinnie Feil   Consent:    Consent obtained:  Verbal   Consent given by:  Patient   Risks discussed:  Infection, need for additional repair, nerve damage, pain, poor cosmetic result, tendon damage and retained foreign body   Alternatives discussed:  Referral Anesthesia (see MAR for exact dosages):    Anesthesia method:  Local infiltration   Local anesthetic:  Lidocaine 1% w/o epi Laceration details:    Location:  Finger   Finger location:  L index finger   Length (cm):  5 Repair type:    Repair type:  Simple Pre-procedure details:    Preparation:  Patient was prepped and draped in usual sterile fashion Exploration:    Hemostasis achieved with:  Tourniquet and direct pressure   Wound exploration: wound explored through full range of motion and entire depth of wound probed and visualized     Wound extent: nerve damage and tendon damage     Wound extent: no underlying fracture noted     Tendon damage location:  Upper extremity   Upper extremity tendon damage location:  Finger flexor   Finger flexor tendon:  Flexor digitorum profundus   Tendon damage extent:  Partial transection   Tendon  repair plan:  Refer for evaluation   Contaminated: yes   Treatment:    Area cleansed with:  Saline and Betadine   Amount of cleaning:  Extensive   Irrigation solution:  Sterile saline   Irrigation volume:  1 L   Irrigation method:  Syringe and pressure wash   Visualized foreign bodies/material removed: no   Skin repair:    Repair method:  Sutures   Suture size:  5-0   Suture material:  Prolene   Suture technique:  Simple interrupted   Number of sutures:  9 Post-procedure details:    Dressing:  Antibiotic ointment, non-adherent dressing and splint  for protection   Patient tolerance of procedure:  Tolerated well, no immediate complications   (including critical care time)  Medications Ordered in ED Medications  lidocaine (PF) (XYLOCAINE) 1 % injection 30 mL (not administered)  Tdap (BOOSTRIX) injection 0.5 mL (0.5 mLs Intramuscular Given 09/16/17 1600)     Initial Impression / Assessment and Plan / ED Course  I have reviewed the triage vital signs and the nursing notes.  Pertinent labs & imaging results that were available during my care of the patient were reviewed by me and considered in my medical decision making (see chart for details).   patient presents with laceration sustained while at work. On exam, he has loss of sensation to light touch and sharp/dull to radial aspect of index finger. There is a questionable partial tear/injury to the flexor tendon vs tendon sheath over the DIP, however pt has full flexion of the digit. X-ray unremarkable. Tetanus given today. Discussed exam findings with patient and concern for possible nerve and tendon injury. Laceration was thoroughly cleansed today, repaired with sutures and splinted. I spoke to Dr. Amedeo Plenty who agrees with ED tx and d/c with plan to go to his clinic on Monday morning for reevaluation. Patient will be discharged with analgesics, splint and follow-up with hand surgery.   Final Clinical Impressions(s) / ED Diagnoses    Final diagnoses:  Laceration of left index finger without foreign body without damage to nail, initial encounter    New Prescriptions Discharge Medication List as of 09/16/2017  5:27 PM       Kinnie Feil, PA-C 09/16/17 1811    Lacretia Leigh, MD 09/19/17 1443

## 2018-08-28 ENCOUNTER — Encounter: Payer: Self-pay | Admitting: Internal Medicine

## 2018-08-28 ENCOUNTER — Ambulatory Visit (INDEPENDENT_AMBULATORY_CARE_PROVIDER_SITE_OTHER): Payer: Commercial Managed Care - PPO | Admitting: Internal Medicine

## 2018-08-28 ENCOUNTER — Other Ambulatory Visit (INDEPENDENT_AMBULATORY_CARE_PROVIDER_SITE_OTHER): Payer: Commercial Managed Care - PPO

## 2018-08-28 VITALS — BP 130/90 | HR 72 | Temp 98.4°F | Ht 72.0 in | Wt 281.0 lb

## 2018-08-28 DIAGNOSIS — F320 Major depressive disorder, single episode, mild: Secondary | ICD-10-CM

## 2018-08-28 DIAGNOSIS — Z23 Encounter for immunization: Secondary | ICD-10-CM

## 2018-08-28 DIAGNOSIS — E6609 Other obesity due to excess calories: Secondary | ICD-10-CM | POA: Diagnosis not present

## 2018-08-28 DIAGNOSIS — Z6838 Body mass index (BMI) 38.0-38.9, adult: Secondary | ICD-10-CM

## 2018-08-28 DIAGNOSIS — Z0000 Encounter for general adult medical examination without abnormal findings: Secondary | ICD-10-CM

## 2018-08-28 DIAGNOSIS — R030 Elevated blood-pressure reading, without diagnosis of hypertension: Secondary | ICD-10-CM | POA: Diagnosis not present

## 2018-08-28 LAB — COMPREHENSIVE METABOLIC PANEL
ALK PHOS: 56 U/L (ref 39–117)
ALT: 35 U/L (ref 0–53)
AST: 24 U/L (ref 0–37)
Albumin: 4.1 g/dL (ref 3.5–5.2)
BILIRUBIN TOTAL: 0.6 mg/dL (ref 0.2–1.2)
BUN: 14 mg/dL (ref 6–23)
CO2: 29 meq/L (ref 19–32)
CREATININE: 0.85 mg/dL (ref 0.40–1.50)
Calcium: 9.1 mg/dL (ref 8.4–10.5)
Chloride: 103 mEq/L (ref 96–112)
GFR: 95.7 mL/min (ref >60.00)
GLUCOSE: 104 mg/dL — AB (ref 70–99)
Potassium: 4.5 mEq/L (ref 3.5–5.1)
Sodium: 137 mEq/L (ref 135–145)
TOTAL PROTEIN: 7.1 g/dL (ref 6.0–8.3)

## 2018-08-28 LAB — LIPID PANEL
CHOL/HDL RATIO: 4
Cholesterol: 192 mg/dL (ref 0–200)
HDL: 44.2 mg/dL (ref >39.00)
LDL CALC: 126 mg/dL — AB (ref 0–99)
NONHDL: 147.56
Triglycerides: 109 mg/dL (ref 0.0–149.0)
VLDL: 21.8 mg/dL (ref 0.0–40.0)

## 2018-08-28 LAB — CBC
HCT: 48.7 % (ref 39.0–52.0)
Hemoglobin: 16 g/dL (ref 13.0–17.0)
MCHC: 32.8 g/dL (ref 30.0–36.0)
MCV: 83.6 fl (ref 78.0–100.0)
PLATELETS: 231 10*3/uL (ref 150.0–400.0)
RBC: 5.83 Mil/uL — ABNORMAL HIGH (ref 4.22–5.81)
RDW: 14.9 % (ref 11.5–15.5)
WBC: 6.8 10*3/uL (ref 4.0–10.5)

## 2018-08-28 LAB — HEMOGLOBIN A1C: Hgb A1c MFr Bld: 6 % (ref 4.6–6.5)

## 2018-08-28 LAB — PSA: PSA: 2.99 ng/mL (ref 0.10–4.00)

## 2018-08-28 MED ORDER — SERTRALINE HCL 100 MG PO TABS: 100.0000 mg | ORAL_TABLET | Freq: Every day | ORAL | 3 refills | Status: DC

## 2018-08-28 NOTE — Progress Notes (Signed)
   Subjective:    Patient ID: Jesse [name], male    DOB: 1951-11-01, 67 y.o.   MRN: [id number]  HPI The patient is a 67 YO man coming in for physical. Had been taking his wife's zoloft 100 mg daily here and there and thinks it help with irritability at home and work.  PMH, [facility], social history reviewed and updated  Review of Systems  Constitutional: Negative.   HENT: Negative.   Eyes: Negative.   Respiratory: Negative for cough, chest tightness and shortness of breath.   Cardiovascular: Negative for chest pain, palpitations and leg swelling.  Gastrointestinal: Negative for abdominal distention, abdominal pain, constipation, diarrhea, nausea and vomiting.  Musculoskeletal: Negative.   Skin: Negative.   Neurological: Negative.   Psychiatric/Behavioral: Positive for dysphoric mood.      Objective:   Physical Exam  Constitutional: He is oriented to person, place, and time. He appears well-developed and well-nourished.  Overweight  HENT:  Head: Normocephalic and atraumatic.  Eyes: EOM are normal.  Neck: Normal range of motion.  Cardiovascular: Normal rate and regular rhythm.  Pulmonary/Chest: Effort normal and breath sounds normal. No respiratory distress. He has no wheezes. He has no rales.  Abdominal: Soft. Bowel sounds are normal. He exhibits no distension. There is no tenderness. There is no rebound.  Musculoskeletal: He exhibits no edema.  Neurological: He is alert and oriented to person, place, and time. Coordination normal.  Skin: Skin is warm and dry.  Psychiatric:  Flat affect   Vitals:   08/28/18 0814  BP: 130/90  Pulse: 72  Temp: 98.4 F (36.9 C)  TempSrc: Oral  SpO2: 93%  Weight: 281 lb (127.5 kg)  Height: 6' (1.829 m)      Assessment & Plan:  Prevnar 13 and Flu shot given at visit

## 2018-08-28 NOTE — Patient Instructions (Signed)
We would like you to schedule with the sports medicine doctor for your knees.   We have sent in zoloft to take at night time to help, take 1 pill daily.    Health Maintenance, Male A healthy lifestyle and preventive care is important for your health and wellness. Ask your health care provider about what schedule of regular examinations is right for you. What should I know about weight and diet? Eat a Healthy Diet  Eat plenty of vegetables, fruits, whole grains, low-fat dairy products, and lean protein.  Do not eat a lot of foods high in solid fats, added sugars, or salt.  Maintain a Healthy Weight Regular exercise can help you achieve or maintain a healthy weight. You should:  Do at least 150 minutes of exercise each week. The exercise should increase your heart rate and make you sweat (moderate-intensity exercise).  Do strength-training exercises at least twice a week.  Watch Your Levels of Cholesterol and Blood Lipids  Have your blood tested for lipids and cholesterol every 5 years starting at 66 years of age. If you are at high risk for heart disease, you should start having your blood tested when you are 66 years old. You may need to have your cholesterol levels checked more often if: ? Your lipid or cholesterol levels are high. ? You are older than 66 years of age. ? You are at high risk for heart disease.  What should I know about cancer screening? Many types of cancers can be detected early and may often be prevented. Lung Cancer  You should be screened every year for lung cancer if: ? You are a current smoker who has smoked for at least 30 years. ? You are a former smoker who has quit within the past 15 years.  Talk to your health care provider about your screening options, when you should start screening, and how often you should be screened.  Colorectal Cancer  Routine colorectal cancer screening usually begins at 66 years of age and should be repeated every 5-10  years until you are 66 years old. You may need to be screened more often if early forms of precancerous polyps or small growths are found. Your health care provider may recommend screening at an earlier age if you have risk factors for colon cancer.  Your health care provider may recommend using home test kits to check for hidden blood in the stool.  A small camera at the end of a tube can be used to examine your colon (sigmoidoscopy or colonoscopy). This checks for the earliest forms of colorectal cancer.  Prostate and Testicular Cancer  Depending on your age and overall health, your health care provider may do certain tests to screen for prostate and testicular cancer.  Talk to your health care provider about any symptoms or concerns you have about testicular or prostate cancer.  Skin Cancer  Check your skin from head to toe regularly.  Tell your health care provider about any new moles or changes in moles, especially if: ? There is a change in a mole's size, shape, or color. ? You have a mole that is larger than a pencil eraser.  Always use sunscreen. Apply sunscreen liberally and repeat throughout the day.  Protect yourself by wearing long sleeves, pants, a wide-brimmed hat, and sunglasses when outside.  What should I know about heart disease, diabetes, and high blood pressure?  If you are 80-87 years of age, have your blood pressure checked every 3-5 years.  If you are 71 years of age or older, have your blood pressure checked every year. You should have your blood pressure measured twice-once when you are at a hospital or clinic, and once when you are not at a hospital or clinic. Record the average of the two measurements. To check your blood pressure when you are not at a hospital or clinic, you can use: ? An automated blood pressure machine at a pharmacy. ? A home blood pressure monitor.  Talk to your health care provider about your target blood pressure.  If you are between  37-46 years old, ask your health care provider if you should take aspirin to prevent heart disease.  Have regular diabetes screenings by checking your fasting blood sugar level. ? If you are at a normal weight and have a low risk for diabetes, have this test once every three years after the age of 20. ? If you are overweight and have a high risk for diabetes, consider being tested at a younger age or more often.  A one-time screening for abdominal aortic aneurysm (AAA) by ultrasound is recommended for men aged 28-75 years who are current or former smokers. What should I know about preventing infection? Hepatitis B If you have a higher risk for hepatitis B, you should be screened for this virus. Talk with your health care provider to find out if you are at risk for hepatitis B infection. Hepatitis C Blood testing is recommended for:  Everyone born from [birth year] through 1964.  Anyone with known risk factors for hepatitis C.  Sexually Transmitted Diseases (STDs)  You should be screened each year for STDs including gonorrhea and chlamydia if: ? You are sexually active and are younger than 66 years of age. ? You are older than 66 years of age and your health care provider tells you that you are at risk for this type of infection. ? Your sexual activity has changed since you were last screened and you are at an increased risk for chlamydia or gonorrhea. Ask your health care provider if you are at risk.  Talk with your health care provider about whether you are at high risk of being infected with HIV. Your health care provider may recommend a prescription medicine to help prevent HIV infection.  What else can I do?  Schedule regular health, dental, and eye exams.  Stay current with your vaccines (immunizations).  Do not use any tobacco products, such as cigarettes, chewing tobacco, and e-cigarettes. If you need help quitting, ask your health care provider.  Limit alcohol intake to no more than  2 drinks per day. One drink equals 12 ounces of beer, 5 ounces of wine, or 1 ounces of hard liquor.  Do not use street drugs.  Do not share needles.  Ask your health care provider for help if you need support or information about quitting drugs.  Tell your health care provider if you often feel depressed.  Tell your health care provider if you have ever been abused or do not feel safe at home. This information is not intended to replace advice given to you by your health care provider. Make sure you discuss any questions you have with your health care provider. Document Released: 04/29/2008 Document Revised: 06/30/2016 Document Reviewed: 08/05/2015 Elsevier Interactive Patient Education  [employer].

## 2018-08-29 DIAGNOSIS — F329 Major depressive disorder, single episode, unspecified: Secondary | ICD-10-CM | POA: Insufficient documentation

## 2018-08-29 DIAGNOSIS — F32A Depression, unspecified: Secondary | ICD-10-CM | POA: Insufficient documentation

## 2018-08-29 NOTE — Assessment & Plan Note (Signed)
Rx for zoloft 100 mg daily which he had taken intermittently which was prescribed for his wife. This has helped him with irritability.

## 2018-08-29 NOTE — Assessment & Plan Note (Signed)
Flu shot given. Pneumonia 13 given. Shingrix counseled. Tetanus up to date. Cologuard ordered. Counseled about sun safety and mole surveillance. Counseled about the dangers of distracted driving. Given 10 year screening recommendations.

## 2018-08-29 NOTE — Assessment & Plan Note (Signed)
BP borderline today and he wishes to work on increasing exercise.

## 2018-08-29 NOTE — Assessment & Plan Note (Signed)
Weight is stable but not decreasing.

## 2018-09-15 NOTE — Progress Notes (Signed)
[name] [address] [place], [place] [address] Phone: [phone] Subjective:   Jesse [name], am serving as a scribe for Dr. Hulan [name].   I'm seeing this patient by the request  of:  Jesse Koch, MD\  CC: Bilateral knee pain  UXN:[id number]  Jesse [name] is a 67 y.o. male coming in with complaint of bilateral knee pain. Pain is intermittent and he feels it in the joint. Pain for the 3 years. He works as a Engineer, building services. Has had sharp shooting pains in his knees. Use IBU to alleviate pain. Wears knee pads when kneeling down. Has tried compression sleeves but felt that they were uncomfortable. Has history of meniscus tear, left knee from over 1 decade ago.  Patient denies any locking or giving out on him.  Patient rates the severity of pain as initially 8 out of 10 but now 1 out of 10.       Past Medical History:  Diagnosis Date  . Arthritis   . Chicken pox   . Diverticulitis   . Hyperlipidemia   . Sleep apnea    69yr ago positive sleep test---never treated   Past Surgical History:  Procedure Laterality Date  . CARPAL TUNNEL RELEASE  2004   right  . CATARACT EXTRACTION, BILATERAL  2014   right, 4/29; left, 3/8  . KNEE ARTHROSCOPY  2009   left  . TONSILLECTOMY  1956   Social History   Socioeconomic History  . Marital status: Married    Spouse name: Not on file  . Number of children: Not on file  . Years of education: Not on file  . Highest education level: Not on file  Occupational History  . Not on file  Social Needs  . Financial resource strain: Not on file  . Food insecurity:    Worry: Not on file    Inability: Not on file  . Transportation needs:    Medical: Not on file    Non-medical: Not on file  Tobacco Use  . Smoking status: Former Smoker    Packs/day: 1.00    Years: 42.00    Pack years: 42.00    Types: Cigarettes    Last attempt to quit: 10/16/2011    Years since quitting: 6.9  . Smokeless  tobacco: Never Used  . Tobacco comment: SMOKED 42 YRS "OFF AND ON"  Substance and Sexual Activity  . Alcohol use: Yes    Comment: occ.  . Drug use: [name]  . Sexual activity: Not on file  Lifestyle  . Physical activity:    Days per week: Not on file    Minutes per session: Not on file  . Stress: Not on file  Relationships  . Social connections:    Talks on phone: Not on file    Gets together: Not on file    Attends religious service: Not on file    Active member of club or organization: Not on file    Attends meetings of clubs or organizations: Not on file    Relationship status: Not on file  Other Topics Concern  . Not on file  Social History Narrative  . Not on file   Allergies  Allergen Reactions  . Penicillins Swelling    Causes severe facial swelling   Family History  Problem Relation Age of Onset  . Cancer Mother        ovary,breast  . Alcohol abuse Father   . Heart attack Father   .  Heart disease Father   . Cancer Sister        uterus  . Cancer Brother   . Diabetes Paternal Grandmother   . Heart attack Paternal Grandfather   . Hyperlipidemia Paternal Grandfather   . Colon cancer Neg Hx        Current Outpatient Medications (Analgesics):  .  aspirin 325 MG tablet, Take 325 mg by mouth daily. .  Ibuprofen 200 MG CAPS, Take 1 capsule by mouth 2 (two) times daily.   Current Outpatient Medications (Other):  .  sertraline (ZOLOFT) 100 MG tablet, Take 1 tablet (100 mg total) by mouth daily.    Past medical history, social, surgical and family history all reviewed in electronic medical record.  [name] pertanent information unless stated regarding to the chief complaint.   Review of Systems:  [name] headache, visual changes, nausea, vomiting, diarrhea, constipation, dizziness, abdominal pain, skin rash, fevers, chills, night sweats, weight loss, swollen lymph nodes, body aches, joint swelling, muscle aches, chest pain, shortness of breath, mood changes.  Mild positive  muscle aches, joint swelling, body aches  Objective  There were [name] vitals taken for this visit.    General: [name] apparent distress alert and oriented x3 mood and affect normal, dressed appropriately.  HEENT: Pupils equal, extraocular movements intact  Respiratory: Patient's speak in full sentences and does not appear short of breath  Cardiovascular: [name] lower extremity edema, non tender, [name] erythema  Skin: Warm dry intact with [name] signs of infection or rash on extremities or on axial skeleton.  Abdomen: Soft nontender  Neuro: Cranial nerves II through XII are intact, neurovascularly intact in all extremities with 2+ DTRs and 2+ pulses.  Lymph: [name] lymphadenopathy of posterior or anterior cervical chain or axillae bilaterally.  Gait antalgic MSK:  Non tender with full range of motion and good stability and symmetric strength and tone of shoulders, elbows, wrist, hip, and ankles bilaterally.  Knee: Bilateral valgus deformity noted.  Normal thigh to calf ratio.  Tender to palpation over medial and PF joint line.  ROM full in flexion and extension and lower leg rotation.  [name] true instability noted today. painful patellar compression. Patellar glide with moderate crepitus. Patellar and quadriceps tendons unremarkable. Hamstring and quadriceps strength is normal.     Impression and Recommendations:      The above documentation has been reviewed and is accurate and complete Jesse Pulley, DO       Note: This dictation was prepared with Dragon dictation along with smaller phrase technology. Any transcriptional errors that result from this process are unintentional.

## 2018-09-18 ENCOUNTER — Ambulatory Visit (INDEPENDENT_AMBULATORY_CARE_PROVIDER_SITE_OTHER): Payer: Commercial Managed Care - PPO | Admitting: Family Medicine

## 2018-09-18 ENCOUNTER — Ambulatory Visit (INDEPENDENT_AMBULATORY_CARE_PROVIDER_SITE_OTHER)
Admission: RE | Admit: 2018-09-18 | Discharge: 2018-09-18 | Disposition: A | Discharge status: Home / Self Care | Payer: Commercial Managed Care - PPO | Source: Ambulatory Visit | Attending: Family Medicine | Admitting: Family Medicine

## 2018-09-18 ENCOUNTER — Ambulatory Visit: Payer: Self-pay

## 2018-09-18 VITALS — BP 138/90 | HR 97 | Ht 72.0 in | Wt 272.0 lb

## 2018-09-18 DIAGNOSIS — M25561 Pain in right knee: Principal | ICD-10-CM

## 2018-09-18 DIAGNOSIS — M25562 Pain in left knee: Secondary | ICD-10-CM

## 2018-09-18 DIAGNOSIS — M170 Bilateral primary osteoarthritis of knee: Secondary | ICD-10-CM | POA: Diagnosis not present

## 2018-09-18 DIAGNOSIS — G8929 Other chronic pain: Secondary | ICD-10-CM | POA: Diagnosis not present

## 2018-09-18 NOTE — Assessment & Plan Note (Signed)
Bilateral knee arthritis.  Patient has elected to try conservative therapy.  Discussed icing regimen and home exercise.  Discussed which activities to do which wants to avoid.  Discussed posture and ergonomics.  Discussed which activities to avoid.  Topical anti-inflammatory trials given.  X-rays ordered.  Follow-up again in 4 to 6 weeks.

## 2018-09-18 NOTE — Patient Instructions (Signed)
Good to see you.  Ice 20 minutes 2 times daily. Usually after activity and before bed. pennsaid pinkie amount topically 2 times daily as needed.  Over the counter get   Vitamin D 2000IU daily  Turmeric 500mg  daily  Tart cherry extract any dose at night Good shoes with rigid bottom.  Jalene Mullet, [place] or New balance greater then 700 Xray downstairs See me again in 4-6 weeks

## 2018-10-16 ENCOUNTER — Ambulatory Visit (INDEPENDENT_AMBULATORY_CARE_PROVIDER_SITE_OTHER): Payer: Commercial Managed Care - PPO | Admitting: Family Medicine

## 2018-10-16 ENCOUNTER — Encounter: Payer: Self-pay | Admitting: Family Medicine

## 2018-10-16 DIAGNOSIS — M170 Bilateral primary osteoarthritis of knee: Secondary | ICD-10-CM

## 2018-10-16 NOTE — Patient Instructions (Signed)
God to see you  You are doing great  Keep it up  Continue the exercises 1-2 times a week  Call [phone] when you need [place] Happy holidays!

## 2018-10-16 NOTE — Assessment & Plan Note (Signed)
Doing well.  Discussed icing regimen and home exercise.  Discussed which activities to do which wants to avoid.  Patient will follow-up with me again as needed

## 2018-10-16 NOTE — Progress Notes (Signed)
[facility] [place],  [address] Phone: [phone] Subjective:    I Jesse [name] am serving as a Education administrator for Dr. Hulan Saas.   CC: Bilateral knee pain follow-up   [id number]  Jesse [name] is a 67 y.o. male coming in with complaint of bilateral knee pain. States the knees are doing well. Exercises help.  Patient was found to have arthritic changes.  States though that with all the conservative therapy including the icing regimen, home exercises, which activities to do which wants to avoid patient is feeling approximately 80% better.       Past Medical History:  Diagnosis Date  . Arthritis   . Chicken pox   . Diverticulitis   . Hyperlipidemia   . Sleep apnea    76yr ago positive sleep test---never treated   Past Surgical History:  Procedure Laterality Date  . CARPAL TUNNEL RELEASE  2004   right  . CATARACT EXTRACTION, BILATERAL  2014   right, 4/29; left, 3/8  . KNEE ARTHROSCOPY  2009   left  . TONSILLECTOMY  1956   Social History   Socioeconomic History  . Marital status: Married    Spouse name: Not on file  . Number of children: Not on file  . Years of education: Not on file  . Highest education level: Not on file  Occupational History  . Not on file  Social Needs  . Financial resource strain: Not on file  . Food insecurity:    Worry: Not on file    Inability: Not on file  . Transportation needs:    Medical: Not on file    Non-medical: Not on file  Tobacco Use  . Smoking status: Former Smoker    Packs/day: 1.00    Years: 42.00    Pack years: 42.00    Types: Cigarettes    Last attempt to quit: 10/16/2011    Years since quitting: 7.0  . Smokeless tobacco: Never Used  . Tobacco comment: SMOKED 42 YRS "OFF AND ON"  Substance and Sexual Activity  . Alcohol use: Yes    Comment: occ.  . Drug use: No  . Sexual activity: Not on file  Lifestyle  . Physical activity:    Days per week: Not on file      Minutes per session: Not on file  . Stress: Not on file  Relationships  . Social connections:    Talks on phone: Not on file    Gets together: Not on file    Attends religious service: Not on file    Active member of club or organization: Not on file    Attends meetings of clubs or organizations: Not on file    Relationship status: Not on file  Other Topics Concern  . Not on file  Social History Narrative  . Not on file   Allergies  Allergen Reactions  . Penicillins Swelling    Causes severe facial swelling   Family History  Problem Relation Age of Onset  . Cancer Mother        ovary,breast  . Alcohol abuse Father   . Heart attack Father   . Heart disease Father   . Cancer Sister        uterus  . Cancer Brother   . Diabetes Paternal Grandmother   . Heart attack Paternal Grandfather   . Hyperlipidemia Paternal Grandfather   . Colon cancer Neg Hx        Current  Outpatient Medications (Analgesics):  .  aspirin 325 MG tablet, Take 325 mg by mouth daily. .  Ibuprofen 200 MG CAPS, Take 1 capsule by mouth 2 (two) times daily.   Current Outpatient Medications (Other):  .  sertraline (ZOLOFT) 100 MG tablet, Take 1 tablet (100 mg total) by mouth daily.    Past medical history, social, surgical and family history all reviewed in electronic medical record.  No pertanent information unless stated regarding to the chief complaint.   Review of Systems:  No headache, visual changes, nausea, vomiting, diarrhea, constipation, dizziness, abdominal pain, skin rash, fevers, chills, night sweats, weight loss, swollen lymph nodes, body aches, joint swelling, muscle aches, chest pain, shortness of breath, mood changes.   Objective  There were no vitals taken for this visit. Systems examined below as of    General: No apparent distress alert and oriented x3 mood and affect normal, dressed appropriately.  HEENT: Pupils equal, extraocular movements intact  Respiratory: Patient's  speak in full sentences and does not appear short of breath  Cardiovascular: No lower extremity edema, non tender, no erythema  Skin: Warm dry intact with no signs of infection or rash on extremities or on axial skeleton.  Abdomen: Soft nontender  Neuro: Cranial nerves II through XII are intact, neurovascularly intact in all extremities with 2+ DTRs and 2+ pulses.  Lymph: No lymphadenopathy of posterior or anterior cervical chain or axillae bilaterally.  Gait normal with good balance and coordination.  MSK:  Non tender with full range of motion and good stability and symmetric strength and tone of shoulders, elbows, wrist, hip, and ankles bilaterally.  Does have some arthritic changes noted as well.  Patient does have some mild instability noted.  Tenderness to palpation over the medial joint line bilaterally right greater than left.    Impression and Recommendations:      The above documentation has been reviewed and is accurate and complete Lyndal Pulley, DO       Note: This dictation was prepared with Dragon dictation along with smaller phrase technology. Any transcriptional errors that result from this process are unintentional.

## 2019-02-14 ENCOUNTER — Encounter: Payer: Self-pay | Admitting: Internal Medicine

## 2019-08-20 ENCOUNTER — Encounter: Payer: Commercial Managed Care - PPO | Admitting: Internal Medicine

## 2019-08-21 ENCOUNTER — Other Ambulatory Visit: Payer: Self-pay | Admitting: Internal Medicine

## 2019-08-21 ENCOUNTER — Encounter: Payer: Commercial Managed Care - PPO | Admitting: Internal Medicine

## 2019-09-04 ENCOUNTER — Other Ambulatory Visit: Payer: Self-pay

## 2019-09-04 ENCOUNTER — Encounter: Payer: Self-pay | Admitting: Internal Medicine

## 2019-09-04 ENCOUNTER — Other Ambulatory Visit (INDEPENDENT_AMBULATORY_CARE_PROVIDER_SITE_OTHER): Payer: Commercial Managed Care - PPO

## 2019-09-04 ENCOUNTER — Ambulatory Visit (INDEPENDENT_AMBULATORY_CARE_PROVIDER_SITE_OTHER): Payer: Commercial Managed Care - PPO | Admitting: Internal Medicine

## 2019-09-04 VITALS — BP 122/84 | HR 77 | Temp 97.7°F | Ht 72.0 in | Wt 282.0 lb

## 2019-09-04 DIAGNOSIS — Z23 Encounter for immunization: Secondary | ICD-10-CM

## 2019-09-04 DIAGNOSIS — E6609 Other obesity due to excess calories: Secondary | ICD-10-CM

## 2019-09-04 DIAGNOSIS — Z0000 Encounter for general adult medical examination without abnormal findings: Secondary | ICD-10-CM

## 2019-09-04 DIAGNOSIS — F320 Major depressive disorder, single episode, mild: Secondary | ICD-10-CM

## 2019-09-04 DIAGNOSIS — G4733 Obstructive sleep apnea (adult) (pediatric): Secondary | ICD-10-CM

## 2019-09-04 DIAGNOSIS — Z6838 Body mass index (BMI) 38.0-38.9, adult: Secondary | ICD-10-CM

## 2019-09-04 LAB — CBC
HCT: 48.3 % (ref 39.0–52.0)
Hemoglobin: 15.8 g/dL (ref 13.0–17.0)
MCHC: 32.7 g/dL (ref 30.0–36.0)
MCV: 84.2 fl (ref 78.0–100.0)
Platelets: 257 10*3/uL (ref 150.0–400.0)
RBC: 5.74 Mil/uL (ref 4.22–5.81)
RDW: 14.5 % (ref 11.5–15.5)
WBC: 7.7 10*3/uL (ref 4.0–10.5)

## 2019-09-04 LAB — COMPREHENSIVE METABOLIC PANEL
ALT: 37 U/L (ref 0–53)
AST: 27 U/L (ref 0–37)
Albumin: 4.2 g/dL (ref 3.5–5.2)
Alkaline Phosphatase: 57 U/L (ref 39–117)
BUN: 13 mg/dL (ref 6–23)
CO2: 28 mEq/L (ref 19–32)
Calcium: 9.2 mg/dL (ref 8.4–10.5)
Chloride: 100 mEq/L (ref 96–112)
Creatinine, Ser: 0.9 mg/dL (ref 0.40–1.50)
GFR: 84.03 mL/min (ref >60.00)
Glucose, Bld: 91 mg/dL (ref 70–99)
Potassium: 3.9 mEq/L (ref 3.5–5.1)
Sodium: 136 mEq/L (ref 135–145)
Total Bilirubin: 0.6 mg/dL (ref 0.2–1.2)
Total Protein: 7.4 g/dL (ref 6.0–8.3)

## 2019-09-04 LAB — HEMOGLOBIN A1C: Hgb A1c MFr Bld: 6.4 % (ref 4.6–6.5)

## 2019-09-04 LAB — LIPID PANEL
Cholesterol: 196 mg/dL (ref 0–200)
HDL: 40.9 mg/dL (ref >39.00)
LDL Cholesterol: 131 mg/dL — ABNORMAL HIGH (ref 0–99)
NonHDL: 154.92
Total CHOL/HDL Ratio: 5
Triglycerides: 121 mg/dL (ref 0.0–149.0)
VLDL: 24.2 mg/dL (ref 0.0–40.0)

## 2019-09-04 MED ORDER — SERTRALINE HCL 100 MG PO TABS: 100.0000 mg | ORAL_TABLET | Freq: Every day | ORAL | 3 refills | Status: DC

## 2019-09-04 NOTE — Assessment & Plan Note (Signed)
Stable on zoloft and wishes to continue. Refilled.

## 2019-09-04 NOTE — Patient Instructions (Signed)

## 2019-09-04 NOTE — Progress Notes (Signed)
   Subjective:   Patient ID: Jesse [name], male    DOB: May 30, 1951, 68 y.o.   MRN: [id number]  HPI The patient is a 68 YO man coming in for physical.   PMH, [place], social history reviewed and updated  Review of Systems  Constitutional: Negative.   HENT: Negative.   Eyes: Negative.   Respiratory: Negative for cough, chest tightness and shortness of breath.   Cardiovascular: Negative for chest pain, palpitations and leg swelling.  Gastrointestinal: Negative for abdominal distention, abdominal pain, constipation, diarrhea, nausea and vomiting.  Musculoskeletal: Negative.   Skin: Negative.   Neurological: Negative.   Psychiatric/Behavioral: Negative.     Objective:  Physical Exam Constitutional:      Appearance: He is well-developed. He is obese.  HENT:     Head: Normocephalic and atraumatic.  Neck:     Musculoskeletal: Normal range of motion.  Cardiovascular:     Rate and Rhythm: Normal rate and regular rhythm.  Pulmonary:     Effort: Pulmonary effort is normal. No respiratory distress.     Breath sounds: Normal breath sounds. No wheezing or rales.  Abdominal:     General: Bowel sounds are normal. There is no distension.     Palpations: Abdomen is soft.     Tenderness: There is no abdominal tenderness. There is no rebound.  Skin:    General: Skin is warm and dry.  Neurological:     Mental Status: He is alert and oriented to person, place, and time.     Coordination: Coordination normal.     Vitals:   09/04/19 1307  BP: 122/84  Pulse: 77  Temp: 97.7 F (36.5 C)  TempSrc: Oral  SpO2: 95%  Weight: 282 lb (127.9 kg)  Height: 6' (1.829 m)    Assessment & Plan:  Flu and pneumonia 23 given at visit

## 2019-09-04 NOTE — Assessment & Plan Note (Signed)
Still using CPAP

## 2019-09-04 NOTE — Assessment & Plan Note (Signed)
Flu shot given. Pneumonia given to complete. Shingrix declines. Tetanus up to date. Colonoscopy refer to GI. Counseled about sun safety and mole surveillance. Counseled about the dangers of distracted driving. Given 10 year screening recommendations.

## 2019-09-04 NOTE — Assessment & Plan Note (Signed)
Weight stable but not decreasing and he feels unable to make much effort for this right now.

## 2019-12-30 ENCOUNTER — Ambulatory Visit: Payer: Commercial Managed Care - PPO | Attending: Internal Medicine

## 2019-12-30 DIAGNOSIS — Z23 Encounter for immunization: Secondary | ICD-10-CM

## 2019-12-30 NOTE — Progress Notes (Signed)
   Covid-19 Vaccination Clinic  Name:  Jesse [name]    MRN: [id number] DOB: 01-19-1951  12/30/2019  Mr. Jesse [name] was observed post Covid-19 immunization for 30 minutes based on pre-vaccination screening without incidence. He was provided with Vaccine Information Sheet and instruction to access the V-Safe system.   Mr. Jesse [name] was instructed to call 911 with any severe reactions post vaccine: Marland Kitchen Difficulty breathing  . Swelling of your face and throat  . A fast heartbeat  . A bad rash all over your body  . Dizziness and weakness    Immunizations Administered    Name Date Dose VIS Date Route   Pfizer COVID-19 Vaccine 12/30/2019 10:01 AM 0.3 mL 10/26/2019 Intramuscular   Manufacturer: [place]   Lot: [id number]   [place]: [id number]

## 2020-01-22 ENCOUNTER — Ambulatory Visit: Payer: Commercial Managed Care - PPO | Attending: Internal Medicine

## 2020-01-22 DIAGNOSIS — Z23 Encounter for immunization: Secondary | ICD-10-CM | POA: Insufficient documentation

## 2020-01-22 NOTE — Progress Notes (Signed)
   Covid-19 Vaccination Clinic  Name:  Seraphin Childrey    MRN: [id number] DOB: March 02, 1951  01/22/2020  Mr. Puertas was observed post Covid-19 immunization for 15 minutes without incident. He was provided with Vaccine Information Sheet and instruction to access the V-Safe system.   Mr. Garven was instructed to call 911 with any severe reactions post vaccine: Marland Kitchen Difficulty breathing  . Swelling of face and throat  . A fast heartbeat  . A bad rash all over body  . Dizziness and weakness   Immunizations Administered    Name Date Dose VIS Date Route   Pfizer COVID-19 Vaccine 01/22/2020 11:08 AM 0.3 mL 10/26/2019 Intramuscular   Manufacturer: [place]   Lot: [id number]   [place]: [id number]

## 2020-07-02 ENCOUNTER — Other Ambulatory Visit: Payer: Self-pay | Admitting: Family

## 2020-07-02 ENCOUNTER — Telehealth (INDEPENDENT_AMBULATORY_CARE_PROVIDER_SITE_OTHER): Payer: Commercial Managed Care - PPO | Admitting: Family

## 2020-07-02 DIAGNOSIS — J019 Acute sinusitis, unspecified: Secondary | ICD-10-CM | POA: Diagnosis not present

## 2020-07-02 MED ORDER — HYDROCODONE-HOMATROPINE 5-1.5 MG/5ML PO SYRP: 5.0000 mL | ORAL_SOLUTION | Freq: Three times a day (TID) | ORAL | 0 refills | Status: DC | PRN

## 2020-07-02 MED ORDER — DOXYCYCLINE HYCLATE 100 MG PO TABS: 100.0000 mg | ORAL_TABLET | Freq: Two times a day (BID) | ORAL | 0 refills | Status: DC

## 2020-07-02 NOTE — Progress Notes (Signed)
Jesse [name] is a 69 y.o. male with the following history as recorded in EpicCare:  Patient Active Problem List   Diagnosis Date Noted  . Degenerative arthritis of knee, bilateral 09/18/2018  . Depression 08/29/2018  . Hyperlipidemia 08/17/2016  . Obesity 03/13/2016  . Obstructive sleep apnea 05/14/2013  . Routine general medical examination at a health care facility 02/05/2013    Current Outpatient Medications  Medication Sig Dispense Refill  . aspirin 325 MG tablet Take 325 mg by mouth daily.    . Ibuprofen 200 MG CAPS Take 1 capsule by mouth 2 (two) times daily.    . sertraline (ZOLOFT) 100 MG tablet Take 1 tablet (100 mg total) by mouth daily. 90 tablet 3  . doxycycline (VIBRA-TABS) 100 MG tablet Take 1 tablet (100 mg total) by mouth 2 (two) times daily. 20 tablet 0   No current facility-administered medications for this visit.    Allergies: Penicillins  Past Medical History:  Diagnosis Date  . Arthritis   . Chicken pox   . Diverticulitis   . Hyperlipidemia   . Sleep apnea    7yr ago positive sleep test---never treated    Past Surgical History:  Procedure Laterality Date  . CARPAL TUNNEL RELEASE  2004   right  . CATARACT EXTRACTION, BILATERAL  2014   right, 4/29; left, 3/8  . KNEE ARTHROSCOPY  2009   left  . TONSILLECTOMY  1956    Family History  Problem Relation Age of Onset  . Cancer Mother        ovary,breast  . Alcohol abuse Father   . Heart attack Father   . Heart disease Father   . Cancer Sister        uterus  . Cancer Brother   . Diabetes Paternal Grandmother   . Heart attack Paternal Grandfather   . Hyperlipidemia Paternal Grandfather   . Colon cancer Neg Hx     Social History   Tobacco Use  . Smoking status: Former Smoker    Packs/day: 1.00    Years: 42.00    Pack years: 42.00    Types: Cigarettes    Quit date: 10/16/2011    Years since quitting: 8.7  . Smokeless tobacco: Never Used  . Tobacco comment: SMOKED 42 YRS "OFF AND ON"   Substance Use Topics  . Alcohol use: Yes    Comment: occ.    Subjective:    I connected with Jesse [name] on 07/02/20 at 11:20 AM EDT by a video enabled telemedicine application and verified that I am speaking with the correct person using two identifiers.   I discussed the limitations of evaluation and management by telemedicine and the availability of in person appointments. The patient expressed understanding and agreed to proceed. Provider in office/ patient is at home; provider and patient are only 2 people on video call.   Concerned for head congestion x 1 week; + sinus pain/ pressure; no fever or shortness of breath; is fully vaccinated against COVID- no known exposure; cough is keeping awake at night; using OTC Nyquil with limited benefit;  Also needs work note to cover for missed time off in the past week;    Objective:  There were no vitals filed for this visit.  General: Well developed, well nourished, in no acute distress  Head: Normocephalic and atraumatic  Lungs: Respirations unlabored;  Neurologic: Alert and oriented; speech intact; face symmetrical;   Assessment:  1. Acute sinusitis, recurrence not specified, unspecified location  Plan:  Rx for Doxycycline 100 mg bid x 10 days; Rx for Hycodan to use at night; increase fluids, rest and follow-up worse, no better; Work note given as requested; if no improvement in symptoms in the next 24-48 hours, he will need to get COVID testing done.   No follow-ups on file.  No orders of the defined types were placed in this encounter.   Requested Prescriptions   Signed Prescriptions Disp Refills  . doxycycline (VIBRA-TABS) 100 MG tablet 20 tablet 0    Sig: Take 1 tablet (100 mg total) by mouth 2 (two) times daily.

## 2020-10-27 ENCOUNTER — Encounter: Payer: Self-pay | Admitting: Internal Medicine

## 2020-10-27 ENCOUNTER — Other Ambulatory Visit: Payer: Self-pay

## 2020-10-27 ENCOUNTER — Ambulatory Visit (INDEPENDENT_AMBULATORY_CARE_PROVIDER_SITE_OTHER): Payer: Commercial Managed Care - PPO | Admitting: Internal Medicine

## 2020-10-27 VITALS — BP 130/80 | HR 81 | Temp 97.8°F | Ht 72.5 in | Wt 286.0 lb

## 2020-10-27 DIAGNOSIS — Z0000 Encounter for general adult medical examination without abnormal findings: Secondary | ICD-10-CM

## 2020-10-27 DIAGNOSIS — E782 Mixed hyperlipidemia: Secondary | ICD-10-CM

## 2020-10-27 DIAGNOSIS — Z23 Encounter for immunization: Secondary | ICD-10-CM | POA: Diagnosis not present

## 2020-10-27 DIAGNOSIS — F320 Major depressive disorder, single episode, mild: Secondary | ICD-10-CM

## 2020-10-27 DIAGNOSIS — G4733 Obstructive sleep apnea (adult) (pediatric): Secondary | ICD-10-CM

## 2020-10-27 LAB — LIPID PANEL
Cholesterol: 171 mg/dL (ref 0–200)
HDL: 41.2 mg/dL (ref >39.00)
LDL Cholesterol: 111 mg/dL — ABNORMAL HIGH (ref 0–99)
NonHDL: 129.43
Total CHOL/HDL Ratio: 4
Triglycerides: 93 mg/dL (ref 0.0–149.0)
VLDL: 18.6 mg/dL (ref 0.0–40.0)

## 2020-10-27 LAB — COMPREHENSIVE METABOLIC PANEL
ALT: 43 U/L (ref 0–53)
AST: 33 U/L (ref 0–37)
Albumin: 4.2 g/dL (ref 3.5–5.2)
Alkaline Phosphatase: 56 U/L (ref 39–117)
BUN: 13 mg/dL (ref 6–23)
CO2: 30 mEq/L (ref 19–32)
Calcium: 9 mg/dL (ref 8.4–10.5)
Chloride: 102 mEq/L (ref 96–112)
Creatinine, Ser: 0.92 mg/dL (ref 0.40–1.50)
GFR: 85.36 mL/min (ref >60.00)
Glucose, Bld: 100 mg/dL — ABNORMAL HIGH (ref 70–99)
Potassium: 4.5 mEq/L (ref 3.5–5.1)
Sodium: 138 mEq/L (ref 135–145)
Total Bilirubin: 0.6 mg/dL (ref 0.2–1.2)
Total Protein: 7.4 g/dL (ref 6.0–8.3)

## 2020-10-27 LAB — HEMOGLOBIN A1C: Hgb A1c MFr Bld: 6.2 % (ref 4.6–6.5)

## 2020-10-27 LAB — CBC
HCT: 48.7 % (ref 39.0–52.0)
Hemoglobin: 16.1 g/dL (ref 13.0–17.0)
MCHC: 33 g/dL (ref 30.0–36.0)
MCV: 83.6 fl (ref 78.0–100.0)
Platelets: 254 10*3/uL (ref 150.0–400.0)
RBC: 5.82 Mil/uL — ABNORMAL HIGH (ref 4.22–5.81)
RDW: 14.3 % (ref 11.5–15.5)
WBC: 6.9 10*3/uL (ref 4.0–10.5)

## 2020-10-27 MED ORDER — SERTRALINE HCL 100 MG PO TABS: 100.0000 mg | ORAL_TABLET | Freq: Every day | ORAL | 3 refills | Status: DC

## 2020-10-27 NOTE — Assessment & Plan Note (Signed)
BMI 38 but complicated by hyperlipidemia, OA, OSA.

## 2020-10-27 NOTE — Assessment & Plan Note (Signed)
Still using CPAP

## 2020-10-27 NOTE — Progress Notes (Signed)
Subjective:   Patient ID: Jesse [name], male    DOB: 1951/03/13, 69 y.o.   MRN: [id number]  HPI Here for medicare wellness and physical, no new complaints. Please see A/P for status and treatment of chronic medical problems.   Diet: heart healthy  Physical activity: sedentary Depression/mood screen: negative Hearing: intact to whispered voice Visual acuity: grossly normal, performs annual eye exam  ADLs: capable Fall risk: none Home safety: good Cognitive evaluation: intact to orientation, naming, recall and repetition EOL planning: adv directives discussed  [place] Visit from 10/27/2020 in [place] at [facility]  PHQ-2 Total Score [place] [facility] Visit from 10/27/2020 in [place] at [facility]  PHQ-9 Total Score 4      I have personally reviewed and have noted 1. The patient's medical and social history - reviewed today no changes 2. Their use of alcohol, tobacco or illicit drugs 3. Their current medications and supplements 4. The patient's functional ability including ADL's, fall risks, home safety risks and hearing or visual impairment. 5. Diet and physical activities 6. Evidence for depression or mood disorders 7. Care team reviewed and updated  Patient Care Team: Hoyt Koch, MD as PCP - General (Internal Medicine) Rigoberto Noel, MD as Consulting Physician (Pulmonary Disease) Past Medical History:  Diagnosis Date  . Arthritis   . Chicken pox   . Diverticulitis   . Hyperlipidemia   . Sleep apnea    101yr ago positive sleep test---never treated   Past Surgical History:  Procedure Laterality Date  . CARPAL TUNNEL RELEASE  2004   right  . CATARACT EXTRACTION, BILATERAL  2014   right, 4/29; left, 3/8  . KNEE ARTHROSCOPY  2009   left  . TONSILLECTOMY  1956   Family History  Problem Relation Age of Onset  . Cancer Mother        ovary,breast  . Alcohol abuse Father   . Heart attack Father   .  Heart disease Father   . Cancer Sister        uterus  . Cancer Brother   . Diabetes Paternal Grandmother   . Heart attack Paternal Grandfather   . Hyperlipidemia Paternal Grandfather   . Colon cancer Neg Hx     Review of Systems  Constitutional: Negative.   HENT: Positive for hearing loss.   Eyes: Negative.   Respiratory: Negative for cough, chest tightness and shortness of breath.   Cardiovascular: Negative for chest pain, palpitations and leg swelling.  Gastrointestinal: Negative for abdominal distention, abdominal pain, constipation, diarrhea, nausea and vomiting.  Musculoskeletal: Negative.   Skin: Negative.   Neurological: Negative.   Psychiatric/Behavioral: Negative.     Objective:  Physical Exam Constitutional:      Appearance: He is well-developed and well-nourished. He is obese.  HENT:     Head: Normocephalic and atraumatic.  Eyes:     Extraocular Movements: EOM normal.  Cardiovascular:     Rate and Rhythm: Normal rate and regular rhythm.  Pulmonary:     Effort: Pulmonary effort is normal. No respiratory distress.     Breath sounds: Normal breath sounds. No wheezing or rales.  Abdominal:     General: Bowel sounds are normal. There is no distension.     Palpations: Abdomen is soft.     Tenderness: There is no abdominal tenderness. There is no rebound.  Musculoskeletal:        General: No edema.  Cervical back: Normal range of motion.  Skin:    General: Skin is warm and dry.  Neurological:     Mental Status: He is alert and oriented to person, place, and time.     Coordination: Coordination normal.  Psychiatric:        Mood and Affect: Mood and affect normal.     Vitals:   10/27/20 1031  BP: 130/80  Pulse: 81  Temp: 97.8 F (36.6 C)  TempSrc: Oral  SpO2: 96%  Weight: 286 lb (129.7 kg)  Height: 6' 0.5" (1.842 m)   This visit occurred during the SARS-CoV-2 public health emergency.  Safety protocols were in place, including screening questions  prior to the visit, additional usage of staff PPE, and extensive cleaning of exam room while observing appropriate contact time as indicated for disinfecting solutions.   Assessment & Plan:  Flu shot given

## 2020-10-27 NOTE — Patient Instructions (Signed)
Let us know if you want to do the cologuard.    Health Maintenance, Male Adopting a healthy lifestyle and getting preventive care are important in promoting health and wellness. Ask your health care provider about:  The right schedule for you to have regular tests and exams.  Things you can do on your own to prevent diseases and keep yourself healthy. What should I know about diet, weight, and exercise? Eat a healthy diet   Eat a diet that includes plenty of vegetables, fruits, low-fat dairy products, and lean protein.  Do not eat a lot of foods that are high in solid fats, added sugars, or sodium. Maintain a healthy weight Body mass index (BMI) is a measurement that can be used to identify possible weight problems. It estimates body fat based on height and weight. Your health care provider can help determine your BMI and help you achieve or maintain a healthy weight. Get regular exercise Get regular exercise. This is one of the most important things you can do for your health. Most adults should:  Exercise for at least 150 minutes each week. The exercise should increase your heart rate and make you sweat (moderate-intensity exercise).  Do strengthening exercises at least twice a week. This is in addition to the moderate-intensity exercise.  Spend less time sitting. Even light physical activity can be beneficial. Watch cholesterol and blood lipids Have your blood tested for lipids and cholesterol at 68 years of age, then have this test every 5 years. You may need to have your cholesterol levels checked more often if:  Your lipid or cholesterol levels are high.  You are older than 68 years of age.  You are at high risk for heart disease. What should I know about cancer screening? Many types of cancers can be detected early and may often be prevented. Depending on your health history and family history, you may need to have cancer screening at various ages. This may include  screening for:  Colorectal cancer.  Prostate cancer.  Skin cancer.  Lung cancer. What should I know about heart disease, diabetes, and high blood pressure? Blood pressure and heart disease  High blood pressure causes heart disease and increases the risk of stroke. This is more likely to develop in people who have high blood pressure readings, are of African descent, or are overweight.  Talk with your health care provider about your target blood pressure readings.  Have your blood pressure checked: ? Every 3-5 years if you are 4-24 years of age. ? Every year if you are 29 years old or older.  If you are between the ages of 42 and 29 and are a current or former smoker, ask your health care provider if you should have a one-time screening for abdominal aortic aneurysm (AAA). Diabetes Have regular diabetes screenings. This checks your fasting blood sugar level. Have the screening done:  Once every three years after age 62 if you are at a normal weight and have a low risk for diabetes.  More often and at a younger age if you are overweight or have a high risk for diabetes. What should I know about preventing infection? Hepatitis B If you have a higher risk for hepatitis B, you should be screened for this virus. Talk with your health care provider to find out if you are at risk for hepatitis B infection. Hepatitis C Blood testing is recommended for:  Everyone born from [birth year] through 1964.  Anyone with known risk factors for  hepatitis C. Sexually transmitted infections (STIs)  You should be screened each year for STIs, including gonorrhea and chlamydia, if: ? You are sexually active and are younger than 69 years of age. ? You are older than 68 years of age and your health care provider tells you that you are at risk for this type of infection. ? Your sexual activity has changed since you were last screened, and you are at increased risk for chlamydia or gonorrhea. Ask your health  care provider if you are at risk.  Ask your health care provider about whether you are at high risk for HIV. Your health care provider may recommend a prescription medicine to help prevent HIV infection. If you choose to take medicine to prevent HIV, you should first get tested for HIV. You should then be tested every 3 months for as long as you are taking the medicine. Follow these instructions at home: Lifestyle  Do not use any products that contain nicotine or tobacco, such as cigarettes, e-cigarettes, and chewing tobacco. If you need help quitting, ask your health care provider.  Do not use street drugs.  Do not share needles.  Ask your health care provider for help if you need support or information about quitting drugs. Alcohol use  Do not drink alcohol if your health care provider tells you not to drink.  If you drink alcohol: ? Limit how much you have to 0-2 drinks a day. ? Be aware of how much alcohol is in your drink. In the [place] one drink equals one 12 oz bottle of beer (355 mL), one 5 oz glass of wine (148 mL), or one 1 oz glass of hard liquor (44 mL). General instructions  Schedule regular health, dental, and eye exams.  Stay current with your vaccines.  Tell your health care provider if: ? You often feel depressed. ? You have ever been abused or do not feel safe at home. Summary  Adopting a healthy lifestyle and getting preventive care are important in promoting health and wellness.  Follow your health care provider's instructions about healthy diet, exercising, and getting tested or screened for diseases.  Follow your health care provider's instructions on monitoring your cholesterol and blood pressure. This information is not intended to replace advice given to you by your health care provider. Make sure you discuss any questions you have with your health care provider. Document Revised: 10/25/2018 Document Reviewed: 10/25/2018 Elsevier Patient Education  2020  [employer].

## 2020-10-27 NOTE — Assessment & Plan Note (Signed)
Taking zoloft 100 mg daily and controlled.

## 2020-10-27 NOTE — Assessment & Plan Note (Signed)
Checking lipid panel and adjust as needed.  

## 2020-10-27 NOTE — Assessment & Plan Note (Signed)
Flu shot given. Covid-19 up to date including booster. Pneumonia complete. Shingrix counseled. Tetanus due 2028. Cologuard due declines. Counseled about sun safety and mole surveillance. Counseled about the dangers of distracted driving. Given 10 year screening recommendations.

## 2021-02-05 ENCOUNTER — Ambulatory Visit: Payer: Commercial Managed Care - PPO | Admitting: Family Medicine

## 2021-02-05 ENCOUNTER — Ambulatory Visit: Payer: Self-pay

## 2021-02-05 ENCOUNTER — Ambulatory Visit (INDEPENDENT_AMBULATORY_CARE_PROVIDER_SITE_OTHER): Payer: Commercial Managed Care - PPO

## 2021-02-05 ENCOUNTER — Other Ambulatory Visit: Payer: Self-pay

## 2021-02-05 VITALS — BP 134/86 | HR 66 | Ht 72.5 in | Wt 284.6 lb

## 2021-02-05 DIAGNOSIS — M25511 Pain in right shoulder: Secondary | ICD-10-CM

## 2021-02-05 NOTE — Patient Instructions (Addendum)
Thank you for coming in today!  I've referred you to Physical Therapy.  Let us know if you don't hear from them in one week.  Call or go to the ER if you develop a large red swollen joint with extreme pain or oozing puss.   Recheck in about 1 month.   Let me know if you have a problem.

## 2021-02-05 NOTE — Progress Notes (Signed)
Jesse [name] is a 70 y.o. male who presents to [place] at [facility] today for R shoulder pain. He was last seen by Dr. Tamala Julian on 10/16/18 for B knee pain. Since then, pt reports R shoulder pain since Saturday, 3/19 when he got home from work, but does not recall any particular mechanism or injury. Pt works as a Dealer. He locates his pain to lateral aspect of R shoulder in the deltoid.  R shoulder mechanical symptoms: no Aggravating factors: should ABD, flex Treatments tried: IBU  Pertinent review of systems: No fevers or chills  Relevant historical information: Sleep apnea.   Exam:  BP 134/86   Pulse 66   Ht 6' 0.5" (1.842 m)   Wt 284 lb 9.6 oz (129.1 kg)   SpO2 96%   BMI 38.07 kg/m  General: Well Developed, well nourished, and in no acute distress.   MSK: Right shoulder normal-appearing Nontender. Range of motion limited abduction due to pain.  Normal external and internal rotation. Strength: Abduction 4/5 external rotation 4/5 internal rotation 5/5. Positive Hawkins and Neer's test.  Negative Yergason's and speeds test. Pulses capillary refill and sensation are intact distally.   Lab and Radiology Results  X-ray images right shoulder obtained today personally independently interpreted High riding humeral head.  AC DJD moderate.  Mild humeral DJD. Await formal radiology review  Procedure: Real-time Ultrasound Guided Injection of right subacromial bursa Device: Philips Affiniti 50G Images permanently stored and available for review in PACS Ultrasound evaluation prior to injection reveals moderate subacromial bursitis.  Intact rotator cuff tendons.  AC DJD present. Verbal informed consent obtained.  Discussed risks and benefits of procedure. Warned about infection bleeding damage to structures skin hypopigmentation and fat atrophy among others. Patient expresses understanding and agreement Time-out conducted.   Noted no overlying erythema,  induration, or other signs of local infection.   Skin prepped in a sterile fashion.   Local anesthesia: Topical Ethyl chloride.   With sterile technique and under real time ultrasound guidance:  40 mg of Kenalog and 2 mL of Marcaine injected into subacromial bursa. Fluid seen entering the bursa.   Completed without difficulty   Pain immediately resolved suggesting accurate placement of the medication.   Advised to call if fevers/chills, erythema, induration, drainage, or persistent bleeding.   Images permanently stored and available for review in the ultrasound unit.  Impression: Technically successful ultrasound guided injection.       Assessment and Plan: 70 y.o. male with right shoulder subacromial bursitis.  Plan for physical therapy and injection.  Discussed work restrictions including heavy lifting restriction.  Recheck in 1 month.   PDMP not reviewed this encounter. Orders Placed This Encounter  Procedures  . [place] LIMITED JOINT SPACE STRUCTURES UP RIGHT(NO LINKED CHARGES)    Standing Status:   Future    Number of Occurrences:   1    Standing Expiration Date:   08/08/2021    Order Specific Question:   Reason for Exam (SYMPTOM  OR DIAGNOSIS REQUIRED)    Answer:   Right shoulder pain    Order Specific Question:   Preferred imaging location?    Answer:   [facility]  . DG Shoulder Right    Standing Status:   Future    Number of Occurrences:   1    Standing Expiration Date:   02/05/2022    Order Specific Question:   Reason for Exam (SYMPTOM  OR DIAGNOSIS REQUIRED)    Answer:  acute right shoulder pain    Order Specific Question:   Preferred imaging location?    Answer:   [facility]  . Ambulatory referral to Physical Therapy    Referral Priority:   Routine    Referral Type:   Physical Medicine    Referral Reason:   Specialty Services Required    Requested Specialty:   Physical Therapy   No orders of the defined types were placed in this  encounter.    Discussed warning signs or symptoms. Please see discharge instructions. Patient expresses understanding.   The above documentation has been reviewed and is accurate and complete Lynne Leader, M.D.

## 2021-02-09 NOTE — Progress Notes (Signed)
X-ray right shoulder does have some arthritis in it but otherwise looks normal to radiology.

## 2021-02-12 ENCOUNTER — Ambulatory Visit: Payer: Commercial Managed Care - PPO | Attending: Family Medicine | Admitting: Physical Therapy

## 2021-02-12 ENCOUNTER — Other Ambulatory Visit: Payer: Self-pay

## 2021-02-12 ENCOUNTER — Encounter: Payer: Self-pay | Admitting: Physical Therapy

## 2021-02-12 DIAGNOSIS — M25511 Pain in right shoulder: Secondary | ICD-10-CM | POA: Diagnosis present

## 2021-02-12 DIAGNOSIS — M25611 Stiffness of right shoulder, not elsewhere classified: Secondary | ICD-10-CM | POA: Insufficient documentation

## 2021-02-12 NOTE — Therapy (Signed)
[place]. [place], [place], [address] Phone: [phone]   Fax:  [phone]  Physical Therapy Evaluation  Patient Details  Name: Jesse [name] MRN: [id number] Date of Birth: 12-19-1950 Referring Provider (PT): Marikay Alar Date: 02/12/2021   PT End of Session - 02/12/21 1037    Visit Number 1    Date for PT Re-Evaluation 04/14/21    Authorization Type UMR    PT Start Time 1010    PT Stop Time 1046    PT Time Calculation (min) 36 min    Activity Tolerance Patient tolerated treatment well    Behavior During Therapy [facility] for tasks assessed/performed           Past Medical History:  Diagnosis Date  . Arthritis   . Chicken pox   . Diverticulitis   . Hyperlipidemia   . Sleep apnea    40yr ago positive sleep test---never treated    Past Surgical History:  Procedure Laterality Date  . CARPAL TUNNEL RELEASE  2004   right  . CATARACT EXTRACTION, BILATERAL  2014   right, 4/29; left, 3/8  . KNEE ARTHROSCOPY  2009   left  . TONSILLECTOMY  1956    There were no vitals filed for this visit.    Subjective Assessment - 02/12/21 1012    Subjective Patient reports that he started having right shoulder pain for the past few weeks, MD US showed intact RC, showed some arthritis.  He had a psositive impingement test    Limitations Lifting;House hold activities    Patient Stated Goals have less pain, no issues with work, sleep better    Currently in Pain? Yes    Pain Score 0-No pain    Pain Location Shoulder    Pain Orientation Right;Lateral    Pain Descriptors / Indicators Aching;Dull    Pain Type Acute pain    Pain Radiating Towards denies    Pain Onset 1 to 4 weeks ago    Pain Frequency Intermittent    Aggravating Factors  reaching up and out, lifting, pain was up to 8/10    Pain Relieving Factors not reaching , rest, the shot has helped some, at best pain was a 1-2/10    Effect of Pain on Daily  Activities diff sleeping and working, has been on light duty last week and for the next 3 weeks              [facility] PT Assessment - 02/12/21 0001      Assessment   Medical Diagnosis right shoulder pain    Referring Provider (PT) Georgina Snell    Onset Date/Surgical Date 01/29/21    Hand Dominance Right    Prior Therapy no      Precautions   Precautions None      Balance Screen   Has the patient fallen in the past 6 months No    Has the patient had a decrease in activity level because of a fear of falling?  No    Is the patient reluctant to leave their home because of a fear of falling?  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#      Posture/Postural Control   Posture Comments fwd head, rounded shoulders  ROM / Strength   AROM / PROM / Strength AROM;Strength      AROM   Overall AROM Comments all right shoulder motions caused pain, worst with abduction    AROM Assessment Site Shoulder    Right/Left Shoulder Right    Right Shoulder Flexion 126 Degrees    Right Shoulder ABduction 130 Degrees    Right Shoulder Internal Rotation 45 Degrees    Right Shoulder External Rotation 75 Degrees      Strength   Overall Strength Comments ER 4-/5 and very painful, IR 5/5, 4/5 with flexion and abduction pain with abduction      Palpation   Palpation comment reports non tender      Special Tests   Other special tests + impingement, negative biceps tests                      Objective measurements completed on examination: See above findings.                 PT Short Term Goals - 02/12/21 1041      PT SHORT TERM GOAL #1   Title indepednent with initial HEP    Time 2    Period Weeks    Status New             PT Long Term Goals - 02/12/21 1041      PT LONG  TERM GOAL #1   Title undertand posture and body mechanics    Time 8    Period Weeks    Status New      PT LONG TERM GOAL #2   Title increase right shoulder AROM to equal the left    Time 8    Period Weeks    Status New      PT LONG TERM GOAL #3   Title report overall pain decreased 50% with ADL's    Time 8    Period Weeks    Status New      PT LONG TERM GOAL #4   Title report no issue with sleeping    Time 8    Period Weeks    Status New                  Plan - 02/12/21 1038    Clinical Impression Statement Patietn reports that he has had reight shoulder pain for a few weeks, unsure of a cause.  He is right handed and a Engineer, building services, he has to reach and lift overhead, MD US showed bursitis and some DJD, RC was intact.  Pain is mostly with abduction and with resisted ER.  He does have limited ROM, he is non tender, has rounded shoulders and forward head, some weakness in the scapular area.  Has some spasms in the upper trap, mild tenderness latral deltoid area    Stability/Clinical Decision Making Stable/Uncomplicated    Clinical Decision Making Low    Rehab Potential Good    PT Frequency 1x / week    PT Duration 8 weeks    PT Treatment/Interventions ADLs/Self Care Home Management;Cryotherapy;Electrical Stimulation;Iontophoresis 4mg /ml Dexamethasone;Therapeutic activities;Therapeutic exercise;Neuromuscular re-education;Manual techniques;Patient/family education    PT Next Visit Plan work on posture, scapular stabilization    Consulted and Agree with Plan of Care Patient           Patient will benefit from skilled therapeutic intervention in order to improve the following deficits and impairments:  Decreased range of motion,Increased muscle spasms,Pain,Impaired flexibility,Improper body  mechanics,Postural dysfunction,Decreased strength,Impaired UE functional use  Visit Diagnosis: Acute pain of right shoulder - Plan: PT plan of care cert/re-cert  Stiffness of  right shoulder, not elsewhere classified - Plan: PT plan of care cert/re-cert     Problem List Patient Active Problem List   Diagnosis Date Noted  . Degenerative arthritis of knee, bilateral 09/18/2018  . Depression 08/29/2018  . Hyperlipidemia 08/17/2016  . Morbid obesity ([place]) 03/13/2016  . Obstructive sleep apnea 05/14/2013  . Routine general medical examination at a health care facility 02/05/2013    Sumner Boast., PT 02/12/2021, 10:43 AM  [place]. [place], [place], [address] Phone: [phone]   Fax:  [phone]  Name: Dao Memmott MRN: [id number] Date of Birth: Jun 24, 1951

## 2021-02-12 NOTE — Patient Instructions (Signed)
Access Code: [email or web address] URL: [email or web address] Date: 02/12/2021 Prepared by: Lum Babe  Exercises Doorway Pec Stretch at 90 Degrees Abduction - 2 x daily - 7 x weekly - 1 sets - 5 reps - 10 hold Squatting Shoulder Row with Anchored Resistance - 2 x daily - 7 x weekly - 1 sets - 10 reps - 3 hold Shoulder Extension with Resistance - 2 x daily - 7 x weekly - 1 sets - 10 reps - 3 hold Shoulder External Rotation and Scapular Retraction with Resistance - 2 x daily - 7 x weekly - 1 sets - 10 reps - 3 hold

## 2021-02-17 ENCOUNTER — Ambulatory Visit: Payer: Commercial Managed Care - PPO | Attending: Family Medicine | Admitting: Physical Therapy

## 2021-02-17 ENCOUNTER — Other Ambulatory Visit: Payer: Self-pay

## 2021-02-17 ENCOUNTER — Encounter: Payer: Self-pay | Admitting: Physical Therapy

## 2021-02-17 DIAGNOSIS — M25511 Pain in right shoulder: Secondary | ICD-10-CM | POA: Diagnosis present

## 2021-02-17 DIAGNOSIS — M25611 Stiffness of right shoulder, not elsewhere classified: Secondary | ICD-10-CM | POA: Diagnosis present

## 2021-02-17 NOTE — Therapy (Signed)
[name], [place], [address] Phone: [phone]   Fax:  [phone]  Physical Therapy Treatment  Patient Details  Name: Jesse [name] MRN: [id number] Date of Birth: [birth year] Referring Provider (PT): Marikay Alar Date: 02/17/2021   PT End of Session - 02/17/21 1106    Visit Number 2    Date for PT Re-Evaluation 04/14/21    PT Start Time 1016    PT Stop Time 1057    PT Time Calculation (min) 41 min    Activity Tolerance Patient tolerated treatment well    Behavior During Therapy [facility] for tasks assessed/performed           Past Medical History:  Diagnosis Date  . Arthritis   . Chicken pox   . Diverticulitis   . Hyperlipidemia   . Sleep apnea    73yr ago positive sleep test---never treated    Past Surgical History:  Procedure Laterality Date  . CARPAL TUNNEL RELEASE  2004   right  . CATARACT EXTRACTION, BILATERAL  2014   right, 4/29; left, 3/8  . KNEE ARTHROSCOPY  2009   left  . TONSILLECTOMY  1956    There were no vitals filed for this visit.   Subjective Assessment - 02/17/21 1023    Subjective Pt reports a little improvement overall; states that HEP is going well, some pain noted with stretching.    Currently in Pain? Yes    Pain Score 2     Pain Location Shoulder    Pain Orientation Right              [facility] PT Assessment - 02/17/21 0001      ROM / Strength   AROM / PROM / Strength PROM      AROM   Right Shoulder Flexion 150 Degrees    Right Shoulder ABduction 152 Degrees    Right Shoulder Internal Rotation 45 Degrees    Right Shoulder External Rotation 77 Degrees      PROM   PROM Assessment Site Shoulder    Right/Left Shoulder Right    Right Shoulder Internal Rotation 75 Degrees    Right Shoulder External Rotation 88 Degrees                         [facility] Adult PT Treatment/Exercise - 02/17/21 0001      Exercises   Exercises Shoulder       Shoulder Exercises: Standing   External Rotation Both;15 reps    Theraband Level (Shoulder External Rotation) Level 1 (Yellow)   notes pain with red TB resolved with use of yellow tb   Internal Rotation Both;15 reps    Theraband Level (Shoulder Internal Rotation) Level 2 (Red)    Extension Both;15 reps    Theraband Level (Shoulder Extension) Level 2 (Red)    Row Both;15 reps    Theraband Level (Shoulder Row) Level 2 (Red)    Other Standing Exercises red tb scap stab 3 ways x5 B    Other Standing Exercises shoulder flex/ext/IR 2# weight bar x10      Shoulder Exercises: ROM/Strengthening   UBE (Upper Arm Bike) L1.5 x3 min each    Lat Pull Limitations 25# 2x10    Cybex Press Limitations 20# 2x10 with serratus push at end range    Cybex Row Limitations 25# 2x10      Manual Therapy   Manual Therapy Passive ROM;Joint mobilization;Soft tissue mobilization  Joint Mobilization gentle distraction R shoulder joint    Soft tissue mobilization STM to R ant delt/UT    Passive ROM PROM IR/ER R shoulder to end range                    PT Short Term Goals - 02/17/21 1108      PT SHORT TERM GOAL #1   Title indepednent with initial HEP    Time 2    Period Weeks    Status Achieved             PT Long Term Goals - 02/12/21 1041      PT LONG TERM GOAL #1   Title undertand posture and body mechanics    Time 8    Period Weeks    Status New      PT LONG TERM GOAL #2   Title increase right shoulder AROM to equal the left    Time 8    Period Weeks    Status New      PT LONG TERM GOAL #3   Title report overall pain decreased 50% with ADL's    Time 8    Period Weeks    Status New      PT LONG TERM GOAL #4   Title report no issue with sleeping    Time 8    Period Weeks    Status New                 Plan - 02/17/21 1106    Clinical Impression Statement Pt tolerated progression to TE well. Pt reporting that he is having increased pain with banded ER; changed  to yellow TB with resolution of pain. Reinforced education on HEP with pt demo understanding. Pt demos improved active R shoulder flexion/abduction this rx; ER/IR unchanged but better passively. Tolerated manual stretching well. Continue to progress scap stab ex's.    PT Treatment/Interventions ADLs/Self Care Home Management;Cryotherapy;Electrical Stimulation;Iontophoresis 4mg /ml Dexamethasone;Therapeutic activities;Therapeutic exercise;Neuromuscular re-education;Manual techniques;Patient/family education    PT Next Visit Plan work on posture, scapular stabilization    Consulted and Agree with Plan of Care Patient           Patient will benefit from skilled therapeutic intervention in order to improve the following deficits and impairments:  Decreased range of motion,Increased muscle spasms,Pain,Impaired flexibility,Improper body mechanics,Postural dysfunction,Decreased strength,Impaired UE functional use  Visit Diagnosis: Acute pain of right shoulder  Stiffness of right shoulder, not elsewhere classified     Problem List Patient Active Problem List   Diagnosis Date Noted  . Degenerative arthritis of knee, bilateral 09/18/2018  . Depression 08/29/2018  . Hyperlipidemia 08/17/2016  . Morbid obesity ([place]) 03/13/2016  . Obstructive sleep apnea 05/14/2013  . Routine general medical examination at a health care facility 02/05/2013   [name], PT, DPT [name] [name] 02/17/2021, 11:09 AM  [place]. [place], [place], [address] Phone: [phone]   Fax:  [phone]  Name: Jesse [name] MRN: [id number] Date of Birth: Aug 17, 1951

## 2021-03-04 ENCOUNTER — Encounter: Payer: Commercial Managed Care - PPO | Admitting: Physical Therapy

## 2021-03-06 ENCOUNTER — Other Ambulatory Visit: Payer: Self-pay

## 2021-03-06 ENCOUNTER — Encounter: Payer: Self-pay | Admitting: Physical Therapy

## 2021-03-06 ENCOUNTER — Ambulatory Visit: Payer: Commercial Managed Care - PPO | Admitting: Physical Therapy

## 2021-03-06 DIAGNOSIS — M25511 Pain in right shoulder: Secondary | ICD-10-CM

## 2021-03-06 DIAGNOSIS — M25611 Stiffness of right shoulder, not elsewhere classified: Secondary | ICD-10-CM

## 2021-03-06 NOTE — Progress Notes (Signed)
   I, Jesse [name], LAT, ATC, am serving as scribe for Dr. Lynne Leader.  Jesse [name] is a 70 y.o. male who presents to [place] at [facility] today for f/u of R shoulder pain that began on 01/31/21 w/ no specific MOI noted.  He was last seen by Dr. Georgina Snell on 02/05/21 and had a R subacromial injection.  He was also referred to PT of which he's completed 3 sessions.  Since his last visit w/ Dr. Georgina Snell, pt reports 80% improvement in shoulder pain. Pt notes increase in AROM. No numbness/tingling noted. Pt states he has 1 more PT session this week and may be dc for PT w/ HEP, pending his visit w/ Dr. Georgina Snell today.   Diagnostic imaging: R shoulder XR- 02/05/21  Pertinent review of systems: No fevers or chills  Relevant historical information: Sleep apnea   Exam:  BP (!) 148/92 (BP Location: Left Arm, Patient Position: Sitting, Cuff Size: Normal)   Pulse 70   Ht 6' 0.5" (1.842 m)   Wt 285 lb (129.3 kg)   SpO2 96%   BMI 38.12 kg/m  General: Well Developed, well nourished, and in no acute distress.   MSK: Right shoulder normal motion Normal strength.  Pain mildly with resisted external rotation Nontender. Negative impingement testing.     Assessment and Plan: 70 y.o. male with right shoulder pain due to subacromial bursitis significantly improved with subacromial injection last month and limited physical therapy.  Plan to continue limited PT likely will transition to home exercise only in the near future.  Continue home exercise program and recheck back with me as needed.  Precautions reviewed.  Discussed warning signs or symptoms. Please see discharge instructions. Patient expresses understanding.   The above documentation has been reviewed and is accurate and complete Lynne Leader, M.D.

## 2021-03-06 NOTE — Therapy (Signed)
[place]. [place], [place], [address] Phone: [phone]   Fax:  [phone]  Physical Therapy Treatment  Patient Details  Name: Jesse [name] MRN: [id number] Date of Birth: 1951/10/28 Referring Provider (PT): Marikay Alar Date: 03/06/2021   PT End of Session - 03/06/21 0844    Visit Number 3    Date for PT Re-Evaluation 04/14/21    PT Start Time 0800    PT Stop Time 0841    PT Time Calculation (min) 41 min    Activity Tolerance Patient tolerated treatment well    Behavior During Therapy [facility] for tasks assessed/performed           Past Medical History:  Diagnosis Date  . Arthritis   . Chicken pox   . Diverticulitis   . Hyperlipidemia   . Sleep apnea    76yrago positive sleep test---never treated    Past Surgical History:  Procedure Laterality Date  . CARPAL TUNNEL RELEASE  2004   right  . CATARACT EXTRACTION, BILATERAL  2014   right, 4/29; left, 3/8  . KNEE ARTHROSCOPY  2009   left  . TONSILLECTOMY  1956    There were no vitals filed for this visit.   Subjective Assessment - 03/06/21 0803    Subjective Pt reports continued improvement    Currently in Pain? No/denies    Pain Score 0-No pain    Pain Location Shoulder    Pain Orientation Right              [facility] PT Assessment - 03/06/21 0001      AROM   Right Shoulder Flexion 160 Degrees    Right Shoulder ABduction 159 Degrees    Right Shoulder Internal Rotation --   to L5   Right Shoulder External Rotation --   to C7                        [facility] Adult PT Treatment/Exercise - 03/06/21 0001      Shoulder Exercises: Standing   External Rotation Right;20 reps    Theraband Level (Shoulder External Rotation) Level 2 (Red)    Internal Rotation Right;20 reps    Theraband Level (Shoulder Internal Rotation) Level 2 (Red)    Flexion Both;20 reps    Shoulder Flexion Weight (lbs) 4    ABduction Both;20 reps     Shoulder ABduction Weight (lbs) 4    Extension Both;20 reps    Extension Weight (lbs) 10    Other Standing Exercises red tb scap stab 3 ways 2x5 B; yellow overhead ball x10    Other Standing Exercises IR 3# weight bar      Shoulder Exercises: ROM/Strengthening   UBE (Upper Arm Bike) L1.5 x3 min each    Lat Pull Limitations 35# 2x10    Cybex Press Limitations 25# 2x15 with serratus push    Cybex Row Limitations 35# 2x10    Ball on Wall x10 5 sec hold                    PT Short Term Goals - 02/17/21 1108      PT SHORT TERM GOAL #1   Title indepednent with initial HEP    Time 2    Period Weeks    Status Achieved             PT Long Term Goals - 03/06/21 03212  PT LONG TERM GOAL #1   Title undertand posture and body mechanics    Time 8    Period Weeks    Status Achieved      PT LONG TERM GOAL #2   Title increase right shoulder AROM to equal the left    Time 8    Period Weeks    Status Partially Met      PT LONG TERM GOAL #3   Title report overall pain decreased 50% with ADL's    Baseline states 80% decrease in pain    Time 8    Period Weeks    Status Achieved      PT LONG TERM GOAL #4   Title report no issue with sleeping    Baseline sometimes still has trouble sleeping on R side    Time 8    Period Weeks    Status Partially Met                 Plan - 03/06/21 0845    Clinical Impression Statement Pt demos improved shoulder ROM from time of eval. Functional improvements in ER/IR. Still demos reports of pain with end range IR. Tolerated progression of scap stab and shoulder ex's well with no c/o of increased R shoulder pain. Making progress toward LTG. Has MD f/u Mon 4/25; follow up on this next rx.    PT Treatment/Interventions ADLs/Self Care Home Management;Cryotherapy;Electrical Stimulation;Iontophoresis 16m/ml Dexamethasone;Therapeutic activities;Therapeutic exercise;Neuromuscular re-education;Manual techniques;Patient/family education     PT Next Visit Plan work on posture, scapular stabilization    Consulted and Agree with Plan of Care Patient           Patient will benefit from skilled therapeutic intervention in order to improve the following deficits and impairments:  Decreased range of motion,Increased muscle spasms,Pain,Impaired flexibility,Improper body mechanics,Postural dysfunction,Decreased strength,Impaired UE functional use  Visit Diagnosis: Acute pain of right shoulder  Stiffness of right shoulder, not elsewhere classified     Problem List Patient Active Problem List   Diagnosis Date Noted  . Degenerative arthritis of knee, bilateral 09/18/2018  . Depression 08/29/2018  . Hyperlipidemia 08/17/2016  . Morbid obesity ([place] 03/13/2016  . Obstructive sleep apnea 05/14/2013  . Routine general medical examination at a health care facility 02/05/2013   AAmador Cunas PT, DPT ADonald ProseSugg 03/06/2021, 9:00 AM  [place] [address]Phone: [phone]  Fax:  [phone] Name: Jesse EnriquezMRN: [id number] of Birth: 4June 02, 1952

## 2021-03-09 ENCOUNTER — Other Ambulatory Visit: Payer: Self-pay

## 2021-03-09 ENCOUNTER — Ambulatory Visit: Payer: Commercial Managed Care - PPO | Admitting: Family Medicine

## 2021-03-09 VITALS — BP 148/92 | HR 70 | Ht 72.5 in | Wt 285.0 lb

## 2021-03-09 DIAGNOSIS — M25511 Pain in right shoulder: Secondary | ICD-10-CM | POA: Diagnosis not present

## 2021-03-09 NOTE — Patient Instructions (Signed)
Thank you for coming in today.  Recheck as needed.   Continue home exercises.   Probably ok to end PT soon.

## 2021-03-11 ENCOUNTER — Ambulatory Visit: Payer: Commercial Managed Care - PPO | Admitting: Physical Therapy

## 2021-03-11 ENCOUNTER — Other Ambulatory Visit: Payer: Self-pay

## 2021-03-11 ENCOUNTER — Encounter: Payer: Self-pay | Admitting: Physical Therapy

## 2021-03-11 DIAGNOSIS — M25611 Stiffness of right shoulder, not elsewhere classified: Secondary | ICD-10-CM

## 2021-03-11 DIAGNOSIS — M25511 Pain in right shoulder: Secondary | ICD-10-CM | POA: Diagnosis not present

## 2021-03-11 NOTE — Therapy (Signed)
[place]. [place], [place], [address] Phone: [phone]   Fax:  [phone]  Physical Therapy Treatment  Patient Details  Name: Jesse [name] MRN: [id number] Date of Birth: [birth year] Referring Provider (PT): Marikay Alar Date: 03/11/2021   PT End of Session - 03/11/21 0842    Visit Number 4    Date for PT Re-Evaluation 04/14/21    PT Start Time 0758    PT Stop Time 0838    PT Time Calculation (min) 40 min    Activity Tolerance Patient tolerated treatment well    Behavior During Therapy [facility] for tasks assessed/performed           Past Medical History:  Diagnosis Date  . Arthritis   . Chicken pox   . Diverticulitis   . Hyperlipidemia   . Sleep apnea    67yrago positive sleep test---never treated    Past Surgical History:  Procedure Laterality Date  . CARPAL TUNNEL RELEASE  2004   right  . CATARACT EXTRACTION, BILATERAL  2014   right, 4/29; left, 3/8  . KNEE ARTHROSCOPY  2009   left  . TONSILLECTOMY  1956    There were no vitals filed for this visit.   Subjective Assessment - 03/11/21 0803    Subjective Pt reports had good appt with MD yesterday; he would like to be put on hold, feels that he is functionally much better and can continue with HEP    Currently in Pain? No/denies                             [facility]      Shoulder Exercises: Standing   External Rotation Both;20 reps    Theraband Level (Shoulder External Rotation) Level 3 (Green)    Internal Rotation Both;20 reps    Theraband Level (Shoulder Internal Rotation) Level 3 (Green)    Flexion Both;20 reps    Shoulder Flexion Weight (lbs) 4    ABduction Both;20 reps    Shoulder ABduction Weight (lbs) 4    Extension Both;20 reps    Extension Weight (lbs) 10    Other Standing Exercises red tb scap stab 3 ways 2x5 B; yellow overhead ball x10    Other Standing  Exercises flex/IR/ext 3# weight bar x10 3 sec hold      Shoulder Exercises: ROM/Strengthening   UBE (Upper Arm Bike) L2 x 3 min each    Lat Pull Limitations 35# 2x15    Cybex Press Limitations 25# 2x15 with serratus push    Cybex Row Limitations 35# 2x15    Ball on Wall x10 5 sec hold                    PT Short Term Goals - 02/17/21 1108      PT SHORT TERM GOAL #1   Title indepednent with initial HEP    Time 2    Period Weeks    Status Achieved             PT Long Term Goals - 03/11/21 03817     PT LONG TERM GOAL #1   Title undertand posture and body mechanics    Time 8    Period Weeks    Status Achieved      PT LONG TERM GOAL #2   Title increase right shoulder AROM to equal  the left    Time 8    Period Weeks    Status Partially Met      PT LONG TERM GOAL #3   Title report overall pain decreased 50% with ADL's    Baseline states 80% decrease in pain    Time 8    Period Weeks    Status Achieved      PT LONG TERM GOAL #4   Title report no issue with sleeping    Time 8    Period Weeks    Status Achieved                 Plan - 03/11/21 0842    Clinical Impression Statement Pt tolerated progression of weights/reps today with no c/o of increased R shoulder pain. Pt on hold this rx per pt request; he is pleased with functional progress and would like to continue with HEP independently. Educated on updated HEP along with when to return if symptoms worsen/recur with VU and agreement.    PT Treatment/Interventions ADLs/Self Care Home Management;Cryotherapy;Electrical Stimulation;Iontophoresis 4mg/ml Dexamethasone;Therapeutic activities;Therapeutic exercise;Neuromuscular re-education;Manual techniques;Patient/family education    PT Next Visit Plan on hold with updated HEP    Consulted and Agree with Plan of Care Patient           Patient will benefit from skilled therapeutic intervention in order to improve the following deficits and impairments:   Decreased range of motion,Increased muscle spasms,Pain,Impaired flexibility,Improper body mechanics,Postural dysfunction,Decreased strength,Impaired UE functional use  Visit Diagnosis: Acute pain of right shoulder  Stiffness of right shoulder, not elsewhere classified     Problem List Patient Active Problem List   Diagnosis Date Noted  . Degenerative arthritis of knee, bilateral 09/18/2018  . Depression 08/29/2018  . Hyperlipidemia 08/17/2016  . Morbid obesity (HCC) 03/13/2016  . Obstructive sleep apnea 05/14/2013  . Routine general medical examination at a health care facility 02/05/2013   Amanda Sugg, PT, DPT Amanda M Sugg 03/11/2021, 8:44 AM  [facility] [address] [place], [place], [address] Phone: [phone]   Fax:  [phone]  Name: Jesse [name] MRN: [id number] Date of Birth: 04/23/1951   

## 2021-09-15 IMAGING — DX DG SHOULDER 2+V*R*
3 series · 3 of 3 positions shown · non-contrast
Comparison: None.

CLINICAL DATA: Shoulder pain.

EXAM:
RIGHT SHOULDER - 2+ VIEW

[shoulder ap (1 of 2)]
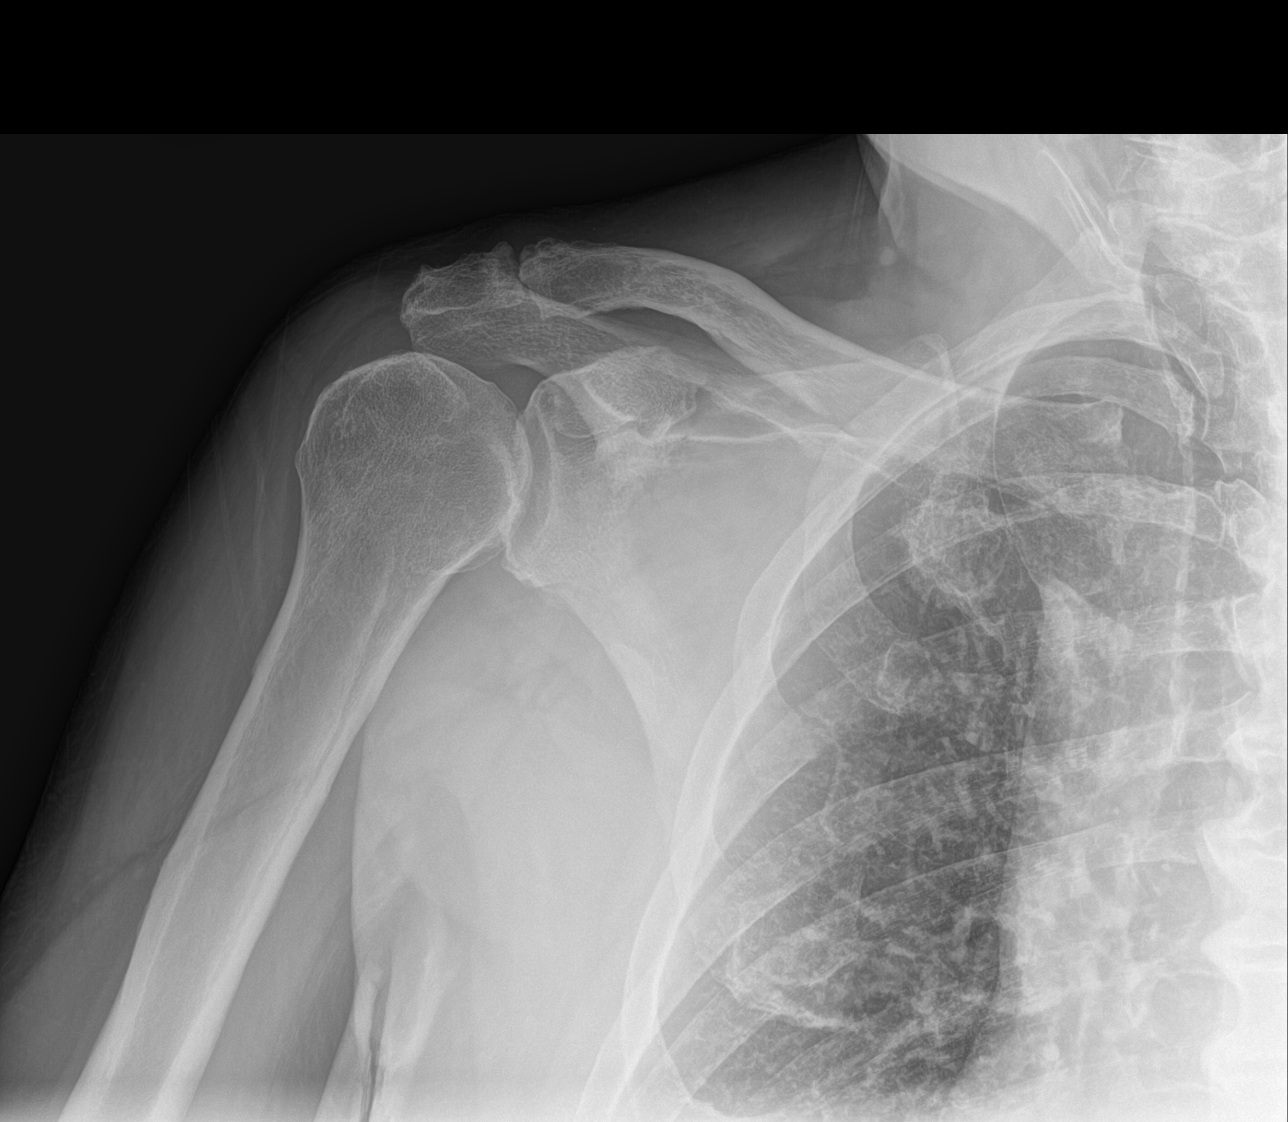

[shoulder ap (2 of 2)]
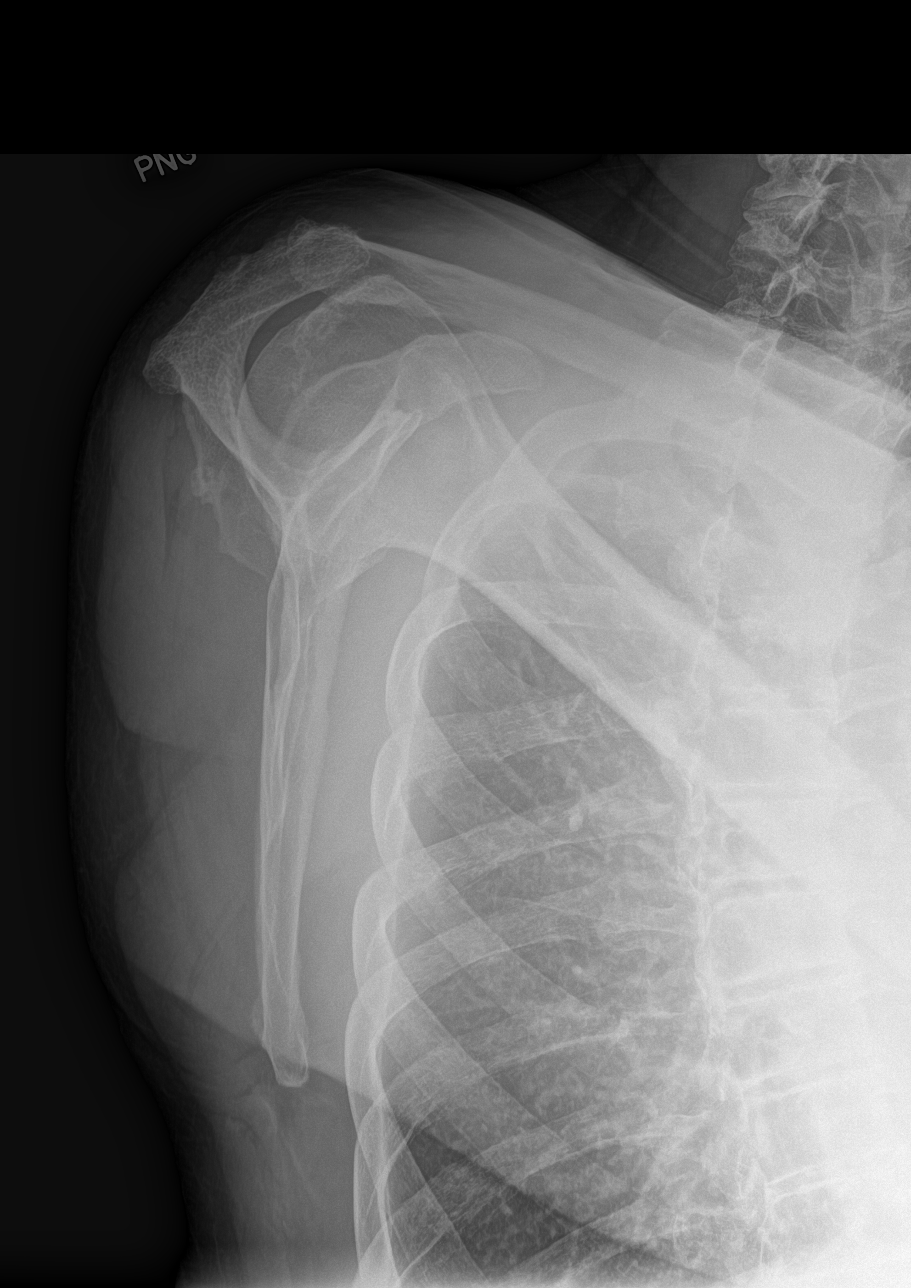

[shoulder axial]
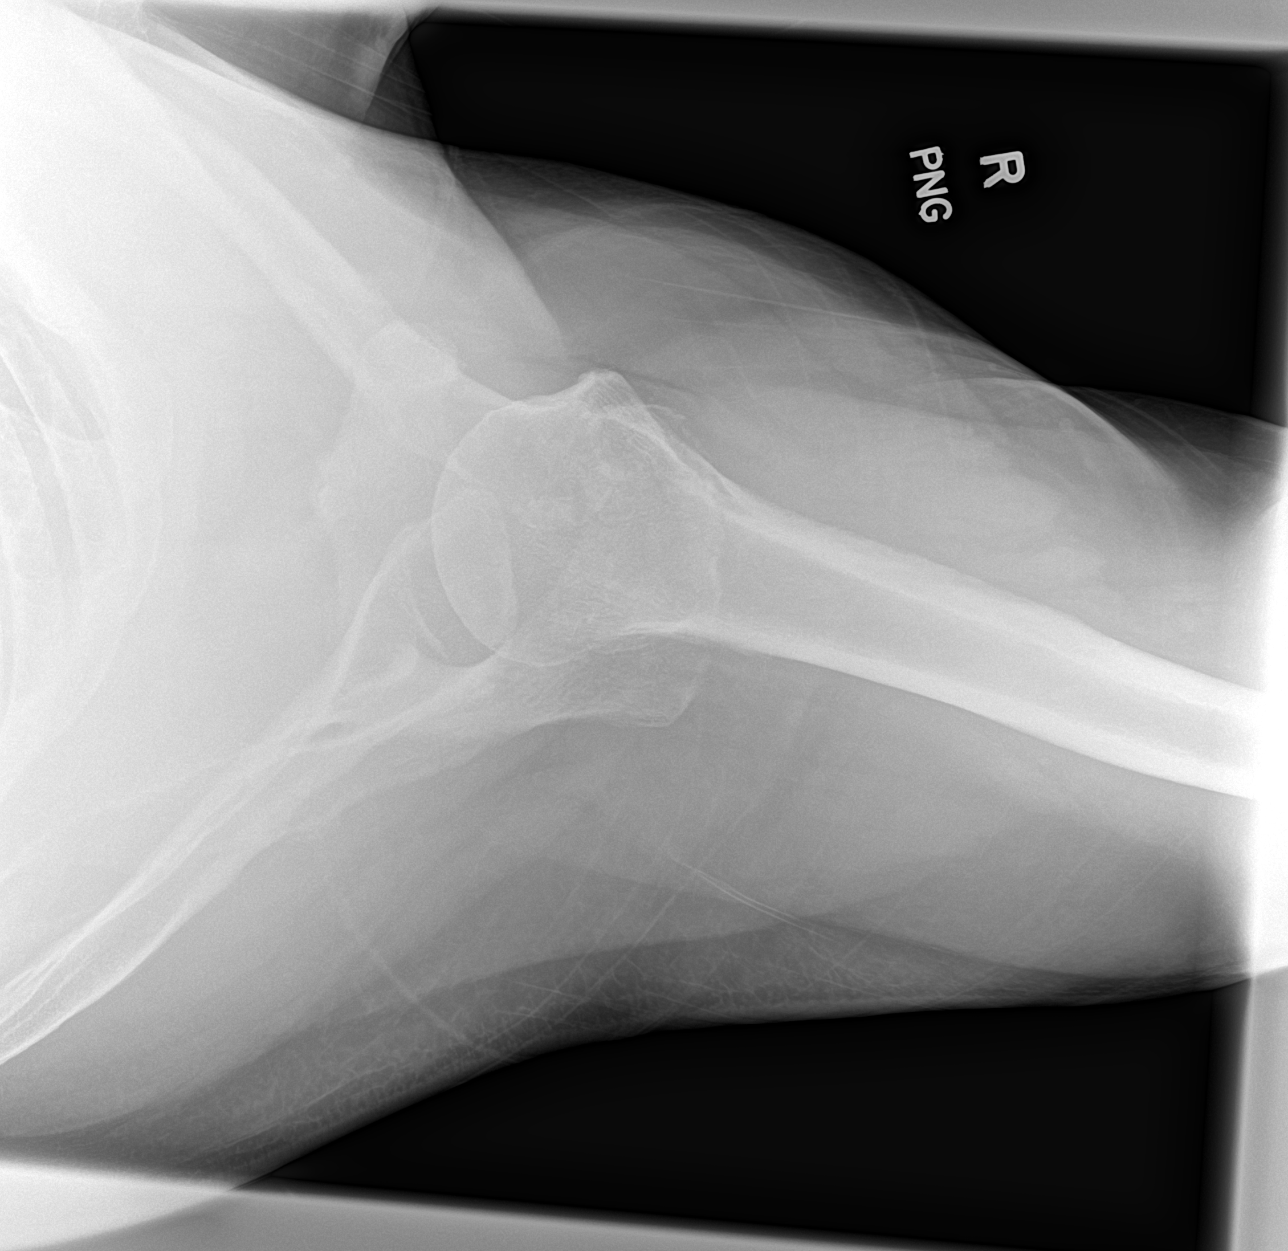

[3 of 3 positions shown; findings below may reference images not displayed]

FINDINGS: No evidence for an acute fracture. No shoulder separation or
dislocation. Mild degenerative changes are seen in the glenohumeral
and acromioclavicular joints. No worrisome lytic or sclerotic
osseous abnormality.
IMPRESSION: No acute bony abnormality.

## 2021-12-08 ENCOUNTER — Other Ambulatory Visit: Payer: Self-pay

## 2021-12-08 ENCOUNTER — Encounter: Payer: Self-pay | Admitting: Internal Medicine

## 2021-12-08 ENCOUNTER — Ambulatory Visit (INDEPENDENT_AMBULATORY_CARE_PROVIDER_SITE_OTHER): Payer: Commercial Managed Care - PPO | Admitting: Internal Medicine

## 2021-12-08 VITALS — BP 124/70 | HR 75 | Resp 18 | Ht 72.5 in | Wt 285.0 lb

## 2021-12-08 DIAGNOSIS — M170 Bilateral primary osteoarthritis of knee: Secondary | ICD-10-CM

## 2021-12-08 DIAGNOSIS — Z1211 Encounter for screening for malignant neoplasm of colon: Secondary | ICD-10-CM | POA: Diagnosis not present

## 2021-12-08 DIAGNOSIS — G4733 Obstructive sleep apnea (adult) (pediatric): Secondary | ICD-10-CM

## 2021-12-08 DIAGNOSIS — Z0000 Encounter for general adult medical examination without abnormal findings: Secondary | ICD-10-CM | POA: Diagnosis not present

## 2021-12-08 DIAGNOSIS — E785 Hyperlipidemia, unspecified: Secondary | ICD-10-CM | POA: Diagnosis not present

## 2021-12-08 DIAGNOSIS — F320 Major depressive disorder, single episode, mild: Secondary | ICD-10-CM

## 2021-12-08 LAB — LIPID PANEL
Cholesterol: 197 mg/dL (ref 0–200)
HDL: 44.7 mg/dL (ref >39.00)
LDL Cholesterol: 129 mg/dL — ABNORMAL HIGH (ref 0–99)
NonHDL: 152.34
Total CHOL/HDL Ratio: 4
Triglycerides: 116 mg/dL (ref 0.0–149.0)
VLDL: 23.2 mg/dL (ref 0.0–40.0)

## 2021-12-08 LAB — COMPREHENSIVE METABOLIC PANEL
ALT: 34 U/L (ref 0–53)
AST: 28 U/L (ref 0–37)
Albumin: 4.4 g/dL (ref 3.5–5.2)
Alkaline Phosphatase: 56 U/L (ref 39–117)
BUN: 18 mg/dL (ref 6–23)
CO2: 31 mEq/L (ref 19–32)
Calcium: 9.6 mg/dL (ref 8.4–10.5)
Chloride: 102 mEq/L (ref 96–112)
Creatinine, Ser: 0.89 mg/dL (ref 0.40–1.50)
GFR: 87.26 mL/min (ref >60.00)
Glucose, Bld: 100 mg/dL — ABNORMAL HIGH (ref 70–99)
Potassium: 4.7 mEq/L (ref 3.5–5.1)
Sodium: 141 mEq/L (ref 135–145)
Total Bilirubin: 0.8 mg/dL (ref 0.2–1.2)
Total Protein: 7.3 g/dL (ref 6.0–8.3)

## 2021-12-08 LAB — CBC
HCT: 48.4 % (ref 39.0–52.0)
Hemoglobin: 15.7 g/dL (ref 13.0–17.0)
MCHC: 32.5 g/dL (ref 30.0–36.0)
MCV: 84.1 fl (ref 78.0–100.0)
Platelets: 262 10*3/uL (ref 150.0–400.0)
RBC: 5.76 Mil/uL (ref 4.22–5.81)
RDW: 14.6 % (ref 11.5–15.5)
WBC: 7.6 10*3/uL (ref 4.0–10.5)

## 2021-12-08 LAB — HEMOGLOBIN A1C: Hgb A1c MFr Bld: 6.2 % (ref 4.6–6.5)

## 2021-12-08 LAB — PSA: PSA: 3.7 ng/mL (ref 0.10–4.00)

## 2021-12-08 NOTE — Assessment & Plan Note (Signed)
BMI 38 and complicated by depression and hyperlipidemia and OSA.

## 2021-12-08 NOTE — Assessment & Plan Note (Signed)
Still doing well with zoloft 100 mg daily and wishes to continue.

## 2021-12-08 NOTE — Assessment & Plan Note (Signed)
Still using CPAP.

## 2021-12-08 NOTE — Assessment & Plan Note (Signed)
Checking lipid panel and adjust as needed. Diet controlled currently.  

## 2021-12-08 NOTE — Patient Instructions (Addendum)
I would recommend to take a tylenol or ibuprofen at night time to help with the pain.

## 2021-12-08 NOTE — Progress Notes (Signed)
Subjective:   Patient ID: Jesse [name], male    DOB: [birth year], 71 y.o.   MRN: [id number]  HPI Here for medicare wellness and physical, no new complaints. Please see A/P for status and treatment of chronic medical problems.   Diet: heart healthy Physical activity: sedentary Depression/mood screen: negative Hearing: intact to whispered voice, bilateral aids Visual acuity: grossly normal, performs annual eye exam  ADLs: capable Fall risk: none Home safety: good Cognitive evaluation: intact to orientation, naming, recall and repetition EOL planning: adv directives discussed  [place] Visit from 12/08/2021 in [place] at [facility]  PHQ-2 Total Score [place] Office Visit from 12/08/2021 in [place] at [facility]  PHQ-9 Total Score 0      Fall Risk 08/18/2015 08/22/2017 08/28/2018 10/27/2020 12/08/2021  Falls in the past year? No No No 0 0  Was there an injury with Fall? - - - 0 0  Fall Risk Category Calculator - - - 0 0  Fall Risk Category - - - Low Low  Patient Fall Risk Level - - - Low fall risk -    I have personally reviewed and have noted 1. The patient's medical and social history - reviewed today no changes 2. Their use of alcohol, tobacco or illicit drugs 3. Their current medications and supplements 4. The patient's functional ability including ADL's, fall risks, home safety risks and hearing or visual impairment. 5. Diet and physical activities 6. Evidence for depression or mood disorders 7. Care team reviewed and updated 8.  The patient is not on an opioid pain medication.  Patient Care Team: Hoyt Koch, MD as PCP - General (Internal Medicine) Rigoberto Noel, MD as Consulting Physician (Pulmonary Disease) Past Medical History:  Diagnosis Date   Arthritis    Chicken pox    Diverticulitis    Hyperlipidemia    Sleep apnea    74yr ago positive sleep test---never treated   Past Surgical History:   Procedure Laterality Date   CARPAL TUNNEL RELEASE  2004   right   CATARACT EXTRACTION, BILATERAL  2014   right, 4/29; left, 3/8   KNEE ARTHROSCOPY  2009   left   TONSILLECTOMY  1956   Family History  Problem Relation Age of Onset   Cancer Mother        ovary,breast   Alcohol abuse Father    Heart attack Father    Heart disease Father    Cancer Sister        uterus   Cancer Brother    Diabetes Paternal Grandmother    Heart attack Paternal Grandfather    Hyperlipidemia Paternal Grandfather    Colon cancer Neg Hx    Review of Systems  Constitutional: Negative.   HENT: Negative.    Eyes: Negative.   Respiratory:  Negative for cough, chest tightness and shortness of breath.   Cardiovascular:  Negative for chest pain, palpitations and leg swelling.  Gastrointestinal:  Negative for abdominal distention, abdominal pain, constipation, diarrhea, nausea and vomiting.  Musculoskeletal:  Positive for arthralgias.  Skin: Negative.   Neurological: Negative.   Psychiatric/Behavioral: Negative.     Objective:  Physical Exam Constitutional:      Appearance: He is well-developed.  HENT:     Head: Normocephalic and atraumatic.  Cardiovascular:     Rate and Rhythm: Normal rate and regular rhythm.  Pulmonary:     Effort: Pulmonary effort is normal. No respiratory distress.  Breath sounds: Normal breath sounds. No wheezing or rales.  Abdominal:     General: Bowel sounds are normal. There is no distension.     Palpations: Abdomen is soft.     Tenderness: There is no abdominal tenderness. There is no rebound.  Musculoskeletal:     Cervical back: Normal range of motion.  Skin:    General: Skin is warm and dry.  Neurological:     Mental Status: He is alert and oriented to person, place, and time.     Coordination: Coordination normal.    Vitals:   12/08/21 0934  BP: 124/70  Pulse: 75  Resp: 18  SpO2: 95%  Weight: 285 lb (129.3 kg)  Height: 6' 0.5" (1.842 m)    This  visit occurred during the SARS-CoV-2 public health emergency.  Safety protocols were in place, including screening questions prior to the visit, additional usage of staff PPE, and extensive cleaning of exam room while observing appropriate contact time as indicated for disinfecting solutions.   Assessment & Plan:

## 2021-12-08 NOTE — Assessment & Plan Note (Signed)
Flu shot up to date. Covid-19 counseled about booster. Pneumonia complete. Shingrix counseled to get at pharmacy. Tetanus due 2028. Colonoscopy due referral to GI. Counseled about sun safety and mole surveillance. Counseled about the dangers of distracted driving. Given 10 year screening recommendations.

## 2021-12-08 NOTE — Assessment & Plan Note (Signed)
Having problems waking up in pain at night. Advised to take tylenol or ibuprofen before going to bed so he can sleep through the night easier.

## 2021-12-10 ENCOUNTER — Other Ambulatory Visit: Payer: Self-pay | Admitting: Internal Medicine

## 2022-01-15 ENCOUNTER — Encounter: Payer: Self-pay | Admitting: Internal Medicine

## 2022-02-09 ENCOUNTER — Encounter: Payer: Self-pay | Admitting: Gastroenterology

## 2022-03-16 ENCOUNTER — Encounter: Payer: Commercial Managed Care - PPO | Admitting: Gastroenterology

## 2022-04-13 ENCOUNTER — Ambulatory Visit (AMBULATORY_SURGERY_CENTER): Payer: Commercial Managed Care - PPO | Admitting: *Deleted

## 2022-04-13 VITALS — Ht 72.0 in | Wt 274.0 lb

## 2022-04-13 DIAGNOSIS — Z1211 Encounter for screening for malignant neoplasm of colon: Secondary | ICD-10-CM

## 2022-04-13 NOTE — Progress Notes (Signed)
No egg or soy allergy known to patient  No issues known to pt with past sedation with any surgeries or procedures Patient denies ever being told they had issues or difficulty with intubation  No FH of Malignant Hyperthermia Pt is not on diet pills Pt is not on  home 02  Pt is not on blood thinners  Pt denies issues with constipation  No A fib or A flutter   PV completed over the phone. Pt verified name, DOB, address and insurance during PV today.   Pt encouraged to call with questions or issues.  If pt has My chart, procedure instructions sent via My Chart  Insurance confirmed with pt at [facility] today

## 2022-04-23 ENCOUNTER — Encounter: Payer: Self-pay | Admitting: Gastroenterology

## 2022-04-28 ENCOUNTER — Encounter: Payer: Self-pay | Admitting: Gastroenterology

## 2022-04-28 ENCOUNTER — Ambulatory Visit (AMBULATORY_SURGERY_CENTER): Payer: Medicare Other | Admitting: Gastroenterology

## 2022-04-28 VITALS — BP 132/75 | HR 67 | Temp 97.7°F | Resp 11 | Ht 72.0 in | Wt 274.0 lb

## 2022-04-28 DIAGNOSIS — D125 Benign neoplasm of sigmoid colon: Secondary | ICD-10-CM | POA: Diagnosis not present

## 2022-04-28 DIAGNOSIS — Z1211 Encounter for screening for malignant neoplasm of colon: Secondary | ICD-10-CM

## 2022-04-28 DIAGNOSIS — D128 Benign neoplasm of rectum: Secondary | ICD-10-CM

## 2022-04-28 DIAGNOSIS — D127 Benign neoplasm of rectosigmoid junction: Secondary | ICD-10-CM | POA: Diagnosis not present

## 2022-04-28 MED ORDER — SODIUM CHLORIDE 0.9 % IV SOLN: 500.0000 mL | Freq: Once | INTRAVENOUS | Status: DC

## 2022-04-28 NOTE — Progress Notes (Signed)
A/ox3, pleased with MAC, report to RN 

## 2022-04-28 NOTE — Progress Notes (Signed)
Called to room to assist during endoscopic procedure.  Patient ID and intended procedure confirmed with present staff. Received instructions for my participation in the procedure from the performing physician.  

## 2022-04-28 NOTE — Op Note (Signed)
[place] Patient Name: Jesse [name] Procedure Date: 04/28/2022 8:42 AM MRN: [id number] Endoscopist: [name] , MD Age: 71 Referring MD:  Date of Birth: 18-Apr-1951 Gender: Male Account #: [id number] Procedure:                Colonoscopy Indications:              Screening for colorectal malignant neoplasm Medicines:                Monitored Anesthesia Care Procedure:                Pre-Anesthesia Assessment:                           - Prior to the procedure, a History and Physical                            was performed, and patient medications and                            allergies were reviewed. The patient's tolerance of                            previous anesthesia was also reviewed. The risks                            and benefits of the procedure and the sedation                            options and risks were discussed with the patient.                            All questions were answered, and informed consent                            was obtained. Prior Anticoagulants: The patient has                            taken no previous anticoagulant or antiplatelet                            agents. ASA Grade Assessment: III - A patient with                            severe systemic disease. After reviewing the risks                            and benefits, the patient was deemed in                            satisfactory condition to undergo the procedure.                           After obtaining informed consent, the colonoscope  was passed under direct vision. Throughout the                            procedure, the patient's blood pressure, pulse, and                            oxygen saturations were monitored continuously. The                            Olympus CF-HQ190L [id number]) Colonoscope was                            introduced through the anus and advanced to the 5                            cm into the  ileum. The colonoscopy was somewhat                            difficult due to a tortuous colon. Successful                            completion of the procedure was aided by changing                            the patient's position, using manual pressure,                            straightening and shortening the scope to obtain                            bowel loop reduction and using scope torsion. The                            patient tolerated the procedure. The quality of the                            bowel preparation was adequate. The terminal ileum,                            ileocecal valve, appendiceal orifice, and rectum                            were photographed. Scope In: 8:49:40 AM Scope Out: 9:19:46 AM Scope Withdrawal Time: 0 hours 23 minutes 25 seconds  Total Procedure Duration: 0 hours 30 minutes 6 seconds  Findings:                 The digital rectal exam findings include                            hemorrhoids. Pertinent negatives include no                            palpable rectal lesions.  The terminal ileum and ileocecal valve appeared                            normal.                           A 18 mm polyp was found in the sigmoid colon. The                            polyp was sessile. The polyp was removed with a                            cold snare. Resection and retrieval were complete.                           Four sessile polyps were found in the rectum. The                            polyps were 2 to 8 mm in size. These polyps were                            removed with a cold snare. Resection and retrieval                            were complete.                           Many small and large-mouthed diverticula were found                            in the entire colon.                           Normal mucosa was found in the entire colon                            otherwise.                           Non-bleeding  non-thrombosed internal hemorrhoids                            were found during retroflexion, during perianal                            exam and during digital exam. The hemorrhoids were                            Grade III (internal hemorrhoids that prolapse but                            require manual reduction). Complications:            No immediate complications. Estimated Blood Loss:     Estimated blood loss was minimal. Impression:               -  Hemorrhoids found on digital rectal exam.                           - The examined portion of the ileum was normal.                           - One 18 mm polyp in the sigmoid colon, removed                            with a cold snare. Resected and retrieved.                           - Four 2 to 8 mm polyps in the rectum, removed with                            a cold snare. Resected and retrieved.                           - Diverticulosis in the entire examined colon.                           - Normal mucosa in the entire examined colon                            otherwise.                           - Non-bleeding non-thrombosed internal hemorrhoids. Recommendation:           - The patient will be observed post-procedure,                            until all discharge criteria are met.                           - Discharge patient to home.                           - Patient has a contact number available for                            emergencies. The signs and symptoms of potential                            delayed complications were discussed with the                            patient. Return to normal activities tomorrow.                            Written discharge instructions were provided to the                            patient.                           -  High fiber diet.                           - Use FiberCon 1-2 tablets PO daily.                           - Continue present medications.                           -  Await pathology results.                           - Repeat colonoscopy in 3 years for surveillance.                            Recommend 1 week of Miralax daily prior to                            colonoscopy preparation next time.                           - The findings and recommendations were discussed                            with the patient.                           - The findings and recommendations were discussed                            with the patient's family. [name], MD 04/28/2022 9:24:35 AM

## 2022-04-28 NOTE — Progress Notes (Signed)
Pt's states no medical or surgical changes since previsit or office visit. 

## 2022-04-28 NOTE — Progress Notes (Signed)
GASTROENTEROLOGY PROCEDURE H&P NOTE   Primary Care Physician: Hoyt Koch, MD  HPI: Jesse [name] is a 71 y.o. male who presents for Colonoscopy for screening.  Past Medical History:  Diagnosis Date   Arthritis    Chicken pox    Diverticulitis    Hyperlipidemia    Sleep apnea    87yrago positive sleep test---never treated   Past Surgical History:  Procedure Laterality Date   CARPAL TUNNEL RELEASE  11/15/2002   right   CATARACT EXTRACTION, BILATERAL  11/15/2012   right, 4/29; left, 3/8   FINGER SURGERY Left 2018   KNEE ARTHROSCOPY  11/16/2007   left   TONSILLECTOMY  11/15/1954   Current Outpatient Medications  Medication Sig Dispense Refill   sertraline (ZOLOFT) 100 MG tablet TAKE 1 TABLET BY MOUTH  DAILY 90 tablet 3   SODIUM FLUORIDE 5000 PLUS 1.1 % CREA dental cream See admin instructions.     aspirin 325 MG tablet Take 325 mg by mouth daily. (Patient not taking: Reported on 12/08/2021)     Ibuprofen 200 MG CAPS Take 1 capsule by mouth 2 (two) times daily.     Current Facility-Administered Medications  Medication Dose Route Frequency Provider Last Rate Last Admin   0.9 %  sodium chloride infusion  500 mL Intravenous Once Mansouraty, GTelford Nab, MD        Current Outpatient Medications:    sertraline (ZOLOFT) 100 MG tablet, TAKE 1 TABLET BY MOUTH  DAILY, Disp: 90 tablet, Rfl: 3   SODIUM FLUORIDE 5000 PLUS 1.1 % CREA dental cream, See admin instructions., Disp: , Rfl:    aspirin 325 MG tablet, Take 325 mg by mouth daily. (Patient not taking: Reported on 12/08/2021), Disp: , Rfl:    Ibuprofen 200 MG CAPS, Take 1 capsule by mouth 2 (two) times daily., Disp: , Rfl:   Current Facility-Administered Medications:    0.9 %  sodium chloride infusion, 500 mL, Intravenous, Once, Mansouraty, GTelford Nab, MD Allergies  Allergen Reactions   Penicillins Swelling    Causes severe facial swelling   Family History  Problem Relation Age of Onset   Breast cancer  Mother    Cancer Mother        ovary,breast   Alcohol abuse Father    Heart attack Father    Heart disease Father    Cancer Sister        uterus   Cancer Brother    Diabetes Paternal Grandmother    Heart attack Paternal Grandfather    Hyperlipidemia Paternal Grandfather    Colon polyps Cousin    Colon cancer Cousin    Esophageal cancer Neg Hx    Stomach cancer Neg Hx    Rectal cancer Neg Hx    Social History   Socioeconomic History   Marital status: Married    Spouse name: Not on file   Number of children: Not on file   Years of education: Not on file   Highest education level: Not on file  Occupational History   Not on file  Tobacco Use   Smoking status: Former    Packs/day: 1.00    Years: 42.00    Total pack years: 42.00    Types: Cigarettes    Quit date: 10/16/2011    Years since quitting: 10.5   Smokeless tobacco: Never   Tobacco comments:    SMOKED 42 YRS "OFF AND ON"  Vaping Use   Vaping Use: Never used  Substance and Sexual Activity  Alcohol use: Yes    Comment: occ.   Drug use: No   Sexual activity: Not on file  Other Topics Concern   Not on file  Social History Narrative   Not on file   Social Determinants of Health   Financial Resource Strain: Not on file  Food Insecurity: Not on file  Transportation Needs: Not on file  Physical Activity: Not on file  Stress: Not on file  Social Connections: Not on file  Intimate Partner Violence: Not on file    Physical Exam: Today's Vitals   04/28/22 0723 04/28/22 0729  BP: 111/74   Pulse: 93   Temp:  97.7 F (36.5 C)  SpO2: 94%   Weight: 274 lb (124.3 kg)   Height: 6' (1.829 m)    Body mass index is 37.16 kg/m. GEN: NAD EYE: Sclerae anicteric ENT: MMM CV: Non-tachycardic GI: Soft, NT/ND NEURO:  Alert & Oriented x 3  Lab Results: No results for input(s): "WBC", "HGB", "HCT", "PLT" in the last 72 hours. BMET No results for input(s): "NA", "K", "CL", "CO2", "GLUCOSE", "BUN", "CREATININE",  "CALCIUM" in the last 72 hours. LFT No results for input(s): "PROT", "ALBUMIN", "AST", "ALT", "ALKPHOS", "BILITOT", "BILIDIR", "IBILI" in the last 72 hours. PT/INR No results for input(s): "LABPROT", "INR" in the last 72 hours.   Impression / Plan: This is a 71 y.o.male who presents for Colonoscopy for screening.  The risks and benefits of endoscopic evaluation/treatment were discussed with the patient and/or family; these include but are not limited to the risk of perforation, infection, bleeding, missed lesions, lack of diagnosis, severe illness requiring hospitalization, as well as anesthesia and sedation related illnesses.  The patient's history has been reviewed, patient examined, no change in status, and deemed stable for procedure.  The patient and/or family is agreeable to proceed.    [name], MD [facility] Advanced [facility]oscopy Office # [phone]

## 2022-04-28 NOTE — Patient Instructions (Signed)
Handouts on polyps, diverticulosis, high fiber diet and hemorrhoids given.  Use FiberCon 1-2 tablets daily. High fiber diet  Await pathology results.  YOU HAD AN ENDOSCOPIC PROCEDURE TODAY AT [facility]:   Refer to the procedure report that was given to you for any specific questions about what was found during the examination.  If the procedure report does not answer your questions, please call your gastroenterologist to clarify.  If you requested that your care partner not be given the details of your procedure findings, then the procedure report has been included in a sealed envelope for you to review at your convenience later.  YOU SHOULD EXPECT: Some feelings of bloating in the abdomen. Passage of more gas than usual.  Walking can help get rid of the air that was put into your GI tract during the procedure and reduce the bloating. If you had a lower endoscopy (such as a colonoscopy or flexible sigmoidoscopy) you may notice spotting of blood in your stool or on the toilet paper. If you underwent a bowel prep for your procedure, you may not have a normal bowel movement for a few days.  Please Note:  You might notice some irritation and congestion in your nose or some drainage.  This is from the oxygen used during your procedure.  There is no need for concern and it should clear up in a day or so.  SYMPTOMS TO REPORT IMMEDIATELY:  Following lower endoscopy (colonoscopy or flexible sigmoidoscopy):  Excessive amounts of blood in the stool  Significant tenderness or worsening of abdominal pains  Swelling of the abdomen that is new, acute  Fever of 100F or higher   For urgent or emergent issues, a gastroenterologist can be reached at any hour by calling [phone]. Do not use MyChart messaging for urgent concerns.    DIET:  We do recommend a small meal at first, but then you may proceed to your regular diet.  Drink plenty of fluids but you should avoid alcoholic  beverages for 24 hours.  ACTIVITY:  You should plan to take it easy for the rest of today and you should NOT DRIVE or use heavy machinery until tomorrow (because of the sedation medicines used during the test).    FOLLOW UP: Our staff will call the number listed on your records 24-72 hours following your procedure to check on you and address any questions or concerns that you may have regarding the information given to you following your procedure. If we do not reach you, we will leave a message.  We will attempt to reach you two times.  During this call, we will ask if you have developed any symptoms of COVID 19. If you develop any symptoms (ie: fever, flu-like symptoms, shortness of breath, cough etc.) before then, please call [phone].  If you test positive for Covid 19 in the 2 weeks post procedure, please call and report this information to [place].    If any biopsies were taken you will be contacted by phone or by letter within the next 1-3 weeks.  Please call us at [phone] if you have not heard about the biopsies in 3 weeks.    SIGNATURES/CONFIDENTIALITY: You and/or your care partner have signed paperwork which will be entered into your electronic medical record.  These signatures attest to the fact that that the information above on your After Visit Summary has been reviewed and is understood.  Full responsibility of the confidentiality of this discharge information lies  with you and/or your care-partner.  

## 2022-04-29 ENCOUNTER — Telehealth: Payer: Self-pay | Admitting: *Deleted

## 2022-04-29 NOTE — Telephone Encounter (Signed)
  Follow up Call-     04/28/2022    7:29 AM  Call back number  Post procedure Call Back phone  # [phone]  Permission to leave phone message Yes     Patient questions:  Do you have a fever, pain , or abdominal swelling? No. Pain Score  0 *  Have you tolerated food without any problems? Yes.    Have you been able to return to your normal activities? Yes.    Do you have any questions about your discharge instructions: Diet   No. Medications  No. Follow up visit  No.  Do you have questions or concerns about your Care? No.  Actions: * If pain score is 4 or above: No action needed, pain <4.

## 2022-04-30 ENCOUNTER — Encounter: Payer: Self-pay | Admitting: Gastroenterology

## 2022-06-17 ENCOUNTER — Encounter: Payer: Self-pay | Admitting: Adult Health

## 2022-06-17 ENCOUNTER — Ambulatory Visit (INDEPENDENT_AMBULATORY_CARE_PROVIDER_SITE_OTHER): Payer: Medicare Other | Admitting: Adult Health

## 2022-06-17 VITALS — BP 124/86 | HR 76 | Temp 97.8°F | Ht 72.0 in | Wt 287.0 lb

## 2022-06-17 DIAGNOSIS — Z87891 Personal history of nicotine dependence: Secondary | ICD-10-CM

## 2022-06-17 DIAGNOSIS — G4733 Obstructive sleep apnea (adult) (pediatric): Secondary | ICD-10-CM | POA: Diagnosis not present

## 2022-06-17 NOTE — Assessment & Plan Note (Signed)
Severe OSA diagnosed in 2014. Has Excellent control and compliance on CPAP with perceived clinical benefit. Continue on current setting .Needs new CPAP machine with alert on current machine that has met life expectancy . Machine is almost 70 years old.  Order to [facility] per patient request   - discussed how weight can impact sleep and risk for sleep disordered breathing - discussed options to assist with weight loss: combination of diet modification, cardiovascular and strength training exercises   - had an extensive discussion regarding the adverse health consequences related to untreated sleep disordered breathing - specifically discussed the risks for hypertension, coronary artery disease, cardiac dysrhythmias, cerebrovascular disease, and diabetes - lifestyle modification discussed   - discussed how sleep disruption can increase risk of accidents, particularly when driving - safe driving practices were discussed   Plan  Patient Instructions  Continue CPAP At bedtime, wear all night long .  Order for new CPAP machine, [facility]  Work on healthy weight  Do not drive if sleepy  Healthy sleep regimen  Refer to Lung Cancer CT chest screening program .  Follow up with Dr. Elsworth Soho or Icess Bertoni NP in 1 year and  As needed

## 2022-06-17 NOTE — Patient Instructions (Addendum)
Continue CPAP At bedtime, wear all night long .  Order for new CPAP machine, [facility]  Work on healthy weight  Do not drive if sleepy  Healthy sleep regimen  Refer to Lung Cancer CT chest screening program .  Follow up with Dr. Elsworth Soho or Thatcher Doberstein NP in 1 year and  As needed

## 2022-06-17 NOTE — Progress Notes (Signed)
$'@Patient'G$  ID: Jesse [name], male    DOB: [birth year], 71 y.o.   MRN: [id number]  Chief Complaint  Patient presents with   Consult    Referring provider: Hoyt Koch, *  HPI: 71 year old male seen for sleep consult June 17, 2022 to reestablish for severe sleep apnea on CPAP Former patient of Dr. Elsworth Soho, last seen in the office September 12, 2017  TEST/EVENTS :  NPSG 07/2013:  AHI 67/hr  06/17/2022 Sleep consult  Patient presents for a sleep consult to reestablish for severe sleep apnea.  Patient is on CPAP at bedtime.  Patient is a former patient of Dr. Elsworth Soho.  He was last seen in the office in September 12, 2017.  Patient says he wears his CPAP every single night.  Cannot sleep without it.  Patient needs to reestablish to get new CPAP. Says Machine has notice that motor has exceeded it life expectancy . Needs new supplies . Uses Resmed Quatro full face maks. Feels well and rested and feels he benefits from CPAP .  CPAP download shows excellent compliance with 100% usage.  Daily average usage at 6 hours.  Patient is on auto CPAP 16 to 19 cm H2O.  AHI is 5.8/hour.  Minimum leaks.  Daily average pressure at 18.7 cm H2O. Epworth score is 10 out of 24.  Typically gets sleepy if he sits down, watches TV and in the afternoon hours. Goes to sleep between 9 and 11 PM.  Goes to sleep very quickly.  Is up 2 times each night usually to go to bathroom or joint pain.   Gets up at 5 AM.  No symptoms suspicious for cataplexy or sleep paralysis.  No history of congestive heart failure or stroke. Occasional nap.  Caffeine intake 5 cups of coffee .  Does not take sleep aids.  Current weight is 287 pounds with a BMI at 38 Remains active , works partime and walks dog daily .   Medical history significant for obstructive sleep apnea, obesity, hyperlipidemia, osteoarthritis,. Anxiety   Surgical history knee surgery, carpal tunnel surgery, tonsillectomy, cataract surgery   Social history patient is  married.  Semi-retired.  Works as a Photographer.  Has no children.  Quit smoking 10 years ago.  Social alcohol twice weekly.  Family history positive for heart disease and cancer    Allergies  Allergen Reactions   Penicillins Swelling    Causes severe facial swelling    Immunization History  Administered Date(s) Administered   Fluad Quad(high Dose 65+) 09/04/2019, 10/27/2020, 09/11/2021   Influenza Whole 09/15/2012   Influenza, High Dose Seasonal PF 08/22/2017, 08/28/2018   Influenza,inj,Quad PF,6+ Mos 08/31/2013, 09/30/2014, 08/11/2015, 08/17/2016   PFIZER(Purple Top)SARS-COV-2 Vaccination 12/30/2019, 01/22/2020, 08/05/2020   Pneumococcal Conjugate-13 08/28/2018   Pneumococcal Polysaccharide-23 09/04/2019   Tdap 08/17/2016, 09/16/2017    Past Medical History:  Diagnosis Date   Arthritis    Chicken pox    Diverticulitis    Hyperlipidemia    Sleep apnea    65yr ago positive sleep test---never treated    Tobacco History: Social History   Tobacco Use  Smoking Status Former   Packs/day: 1.00   Years: 42.00   Total pack years: 42.00   Types: Cigarettes   Quit date: 10/16/2011   Years since quitting: 10.6  Smokeless Tobacco Never  Tobacco Comments   SMOKED 63 YRS "OFF AND ON"   Counseling given: Not Answered Tobacco comments: SMOKED 43 YRS "OFF AND ON"   Outpatient Medications Prior to Visit  Medication Sig Dispense Refill   Ibuprofen 200 MG CAPS Take 1 capsule by mouth 2 (two) times daily.     sertraline (ZOLOFT) 100 MG tablet TAKE 1 TABLET BY MOUTH  DAILY 90 tablet 3   SODIUM FLUORIDE 5000 PLUS 1.1 % CREA dental cream See admin instructions.     aspirin 325 MG tablet Take 325 mg by mouth daily. (Patient not taking: Reported on 12/08/2021)     No facility-administered medications prior to visit.     Review of Systems:   Constitutional:   No  weight loss, night sweats,  Fevers, chills, fatigue, or  lassitude.  HEENT:   No headaches,  Difficulty  swallowing,  Tooth/dental problems, or  Sore throat,                No sneezing, itching, ear ache, nasal congestion, post nasal drip,   CV:  No chest pain,  Orthopnea, PND, swelling in lower extremities, anasarca, dizziness, palpitations, syncope.   GI  No heartburn, indigestion, abdominal pain, nausea, vomiting, diarrhea, change in bowel habits, loss of appetite, bloody stools.   Resp: No shortness of breath with exertion or at rest.  No excess mucus, no productive cough,  No non-productive cough,  No coughing up of blood.  No change in color of mucus.  No wheezing.  No chest wall deformity  Skin: no rash or lesions.  GU: no dysuria, change in color of urine, no urgency or frequency.  No flank pain, no hematuria   MS: +joint pains .    Physical Exam  BP 124/86 (BP Location: Left Arm, Patient Position: Sitting, Cuff Size: Normal)   Pulse 76   Temp 97.8 F (36.6 C) (Oral)   Ht 6' (1.829 m)   Wt 287 lb (130.2 kg)   SpO2 96%   BMI 38.92 kg/m   GEN: A/Ox3; pleasant , NAD, well nourished    HEENT:  [place]/AT,   NOSE-clear, THROAT-clear, no lesions, no postnasal drip or exudate noted. Class 2-3 MP airway   NECK:  Supple w/ fair ROM; no JVD; normal carotid impulses w/o bruits; no thyromegaly or nodules palpated; no lymphadenopathy.    RESP  Clear  P & A; w/o, wheezes/ rales/ or rhonchi. no accessory muscle use, no dullness to percussion  CARD:  RRR, no m/r/g, tr peripheral edema, pulses intact, no cyanosis or clubbing.  GI:   Soft & nt; nml bowel sounds; no organomegaly or masses detected.   Musco: Warm bil, no deformities or joint swelling noted.   Neuro: alert, no focal deficits noted.    Skin: Warm, no lesions or rashes    Lab Results:  CBC  ProBNP No results found for: "PROBNP"  Imaging: No results found.        No data to display          No results found for: "NITRICOXIDE"      Assessment & Plan:   Obstructive sleep apnea Severe OSA diagnosed  in 2014. Has Excellent control and compliance on CPAP with perceived clinical benefit. Continue on current setting .Needs new CPAP machine with alert on current machine that has met life expectancy . Machine is almost 70 years old.  Order to [facility] per patient request   - discussed how weight can impact sleep and risk for sleep disordered breathing - discussed options to assist with weight loss: combination of diet modification, cardiovascular and strength training exercises   - had an extensive discussion regarding the adverse health consequences  related to untreated sleep disordered breathing - specifically discussed the risks for hypertension, coronary artery disease, cardiac dysrhythmias, cerebrovascular disease, and diabetes - lifestyle modification discussed   - discussed how sleep disruption can increase risk of accidents, particularly when driving - safe driving practices were discussed   Plan  Patient Instructions  Continue CPAP At bedtime, wear all night long .  Order for new CPAP machine, [facility]  Work on healthy weight  Do not drive if sleepy  Healthy sleep regimen  Refer to Lung Cancer CT chest screening program .  Follow up with Dr. Elsworth Soho or Deshea Pooley NP in 1 year and  As needed       Former smoker Former smoker quit , 67yrpack hx , qualifies for LDCT program.  Discussed with patient referral sent .   Plan  Patient Instructions  Continue CPAP At bedtime, wear all night long .  Order for new CPAP machine, [facility]  Work on healthy weight  Do not drive if sleepy  Healthy sleep regimen  Refer to Lung Cancer CT chest screening program .  Follow up with Dr. AElsworth Sohoor Wade Asebedo NP in 1 year and  As needed         TRexene Edison NP 06/17/2022

## 2022-06-17 NOTE — Assessment & Plan Note (Addendum)
Former smoker quit 2012  , 35yrpack hx , qualifies for LDCT program.  Discussed with patient referral sent .   Plan  Patient Instructions  Continue CPAP At bedtime, wear all night long .  Order for new CPAP machine, [facility]  Work on healthy weight  Do not drive if sleepy  Healthy sleep regimen  Refer to Lung Cancer CT chest screening program .  Follow up with Dr. AElsworth Sohoor Wilkes Potvin NP in 1 year and  As needed

## 2022-07-26 DIAGNOSIS — G4733 Obstructive sleep apnea (adult) (pediatric): Secondary | ICD-10-CM | POA: Diagnosis not present

## 2022-07-28 ENCOUNTER — Other Ambulatory Visit: Payer: Self-pay

## 2022-07-28 DIAGNOSIS — Z122 Encounter for screening for malignant neoplasm of respiratory organs: Secondary | ICD-10-CM

## 2022-07-28 DIAGNOSIS — F17210 Nicotine dependence, cigarettes, uncomplicated: Secondary | ICD-10-CM

## 2022-07-28 DIAGNOSIS — Z87891 Personal history of nicotine dependence: Secondary | ICD-10-CM

## 2022-08-04 ENCOUNTER — Encounter: Payer: Self-pay | Admitting: Internal Medicine

## 2022-08-04 ENCOUNTER — Ambulatory Visit (INDEPENDENT_AMBULATORY_CARE_PROVIDER_SITE_OTHER): Payer: Medicare Other

## 2022-08-04 DIAGNOSIS — Z23 Encounter for immunization: Secondary | ICD-10-CM

## 2022-08-04 NOTE — Progress Notes (Signed)
Administered High dose flu on right arm. Pt tolerated well.

## 2022-08-04 NOTE — Progress Notes (Signed)
error 

## 2022-08-25 DIAGNOSIS — G4733 Obstructive sleep apnea (adult) (pediatric): Secondary | ICD-10-CM | POA: Diagnosis not present

## 2022-08-27 DIAGNOSIS — G4733 Obstructive sleep apnea (adult) (pediatric): Secondary | ICD-10-CM | POA: Diagnosis not present

## 2022-09-01 ENCOUNTER — Ambulatory Visit: Payer: Medicare Other | Admitting: Adult Health

## 2022-09-01 ENCOUNTER — Encounter: Payer: Self-pay | Admitting: Adult Health

## 2022-09-01 DIAGNOSIS — G4733 Obstructive sleep apnea (adult) (pediatric): Secondary | ICD-10-CM

## 2022-09-01 DIAGNOSIS — Z87891 Personal history of nicotine dependence: Secondary | ICD-10-CM | POA: Diagnosis not present

## 2022-09-01 NOTE — Patient Instructions (Addendum)
Continue CPAP At bedtime, wear all night long .  work on healthy weight  Do not drive if sleepy  Healthy sleep regimen  Lung Cancer CT chest screening next week as planned.  Follow up with Dr. Elsworth Soho or Joven Mom NP in 1 year and  As needed

## 2022-09-01 NOTE — Assessment & Plan Note (Signed)
Lung cancer screening program as discussed

## 2022-09-01 NOTE — Progress Notes (Signed)
$'@Patient'k$  ID: Jesse [name], male    DOB: [birth year], 71 y.o.   MRN: [id number]  Chief Complaint  Patient presents with   Follow-up    Referring provider: Hoyt Koch, *  HPI: 71 year old male seen for sleep consult June 17, 2022 to reestablish for sleep apnea Former patient of Dr. Elsworth Soho  TEST/EVENTS :  NPSG 07/2013:  AHI 67/hr  09/01/2022 Follow up : OSA  Patient presents for a 76-monthfollow-up.  Patient was seen last visit for sleep consult to reestablish for sleep apnea.  Patient has been on CPAP for a long time.  Says he cannot sleep without it.  Patient says he feels he benefits from CPAP with decreased daytime sleepiness.  At last visit patient CPAP machine was getting old and a new CPAP was ordered.  Patient says his new CPAP is working very well.  He uses the [employer]full facemask.  CPAP download shows excellent compliance with 100% usage.  Daily average usage at 7 hours.  Patient is on auto CPAP 16 to 19 cm H2O.  AHI is 4.3/hour.    Allergies  Allergen Reactions   Penicillins Swelling    Causes severe facial swelling    Immunization History  Administered Date(s) Administered   Fluad Quad(high Dose 65+) 09/04/2019, 10/27/2020, 09/11/2021, 08/04/2022   Influenza Whole 09/15/2012   Influenza, High Dose Seasonal PF 08/22/2017, 08/28/2018   Influenza,inj,Quad PF,6+ Mos 08/31/2013, 09/30/2014, 08/11/2015, 08/17/2016   PFIZER(Purple Top)SARS-COV-2 Vaccination 12/30/2019, 01/22/2020, 08/05/2020   Pneumococcal Conjugate-13 08/28/2018   Pneumococcal Polysaccharide-23 09/04/2019   Tdap 08/17/2016, 09/16/2017    Past Medical History:  Diagnosis Date   Arthritis    Chicken pox    Diverticulitis    Hyperlipidemia    Sleep apnea    89yrgo positive sleep test---never treated    Tobacco History: Social History   Tobacco Use  Smoking Status Former   Packs/day: 1.00   Years: 42.00   Total pack years: 42.00   Types: Cigarettes   Quit date:  10/16/2011   Years since quitting: 10.8  Smokeless Tobacco Never  Tobacco Comments   SMOKED 4260RS "OFF AND ON"   Counseling given: Not Answered Tobacco comments: SMOKED 42 YRS "OFF AND ON"   Outpatient Medications Prior to Visit  Medication Sig Dispense Refill   aspirin 325 MG tablet Take 325 mg by mouth daily.     Ibuprofen 200 MG CAPS Take 1 capsule by mouth 2 (two) times daily.     sertraline (ZOLOFT) 100 MG tablet TAKE 1 TABLET BY MOUTH  DAILY 90 tablet 3   SODIUM FLUORIDE 5000 PLUS 1.1 % CREA dental cream See admin instructions.     No facility-administered medications prior to visit.     Review of Systems:   Constitutional:   No  weight loss, night sweats,  Fevers, chills, fatigue, or  lassitude.  HEENT:   No headaches,  Difficulty swallowing,  Tooth/dental problems, or  Sore throat,                No sneezing, itching, ear ache, nasal congestion, post nasal drip,   CV:  No chest pain,  Orthopnea, PND, swelling in lower extremities, anasarca, dizziness, palpitations, syncope.   GI  No heartburn, indigestion, abdominal pain, nausea, vomiting, diarrhea, change in bowel habits, loss of appetite, bloody stools.   Resp: No shortness of breath with exertion or at rest.  No excess mucus, no productive cough,  No non-productive cough,  No coughing up  of blood.  No change in color of mucus.  No wheezing.  No chest wall deformity  Skin: no rash or lesions.  GU: no dysuria, change in color of urine, no urgency or frequency.  No flank pain, no hematuria   MS:  No joint pain or swelling.  No decreased range of motion.  No back pain.    Physical Exam  BP 118/70 (BP Location: Left Arm, Patient Position: Sitting, Cuff Size: Large)   Pulse 75   Temp 98.2 F (36.8 C) (Oral)   Ht 6' (1.829 m)   Wt 291 lb 12.8 oz (132.4 kg)   SpO2 95%   BMI 39.58 kg/m   GEN: A/Ox3; pleasant , NAD, well nourished    HEENT:  [place]/AT,   NOSE-clear, THROAT-clear, no lesions, no postnasal drip or  exudate noted.  Class III MP airway  NECK:  Supple w/ fair ROM; no JVD; normal carotid impulses w/o bruits; no thyromegaly or nodules palpated; no lymphadenopathy.    RESP  Clear  P & A; w/o, wheezes/ rales/ or rhonchi. no accessory muscle use, no dullness to percussion  CARD:  RRR, no m/r/g, no peripheral edema, pulses intact, no cyanosis or clubbing.  GI:   Soft & nt; nml bowel sounds; no organomegaly or masses detected.   Musco: Warm bil, no deformities or joint swelling noted.   Neuro: alert, no focal deficits noted.    Skin: Warm, no lesions or rashes    Lab Results:  CBC    Component Value Date/Time   WBC 7.6 12/08/2021 1026   RBC 5.76 12/08/2021 1026   HGB 15.7 12/08/2021 1026   HCT 48.4 12/08/2021 1026   PLT 262.0 12/08/2021 1026   MCV 84.1 12/08/2021 1026   MCHC 32.5 12/08/2021 1026   RDW 14.6 12/08/2021 1026   LYMPHSABS 2.0 08/18/2015 1012   MONOABS 0.6 08/18/2015 1012   EOSABS 0.1 08/18/2015 1012   BASOSABS 0.0 08/18/2015 1012    BMET    Component Value Date/Time   NA 141 12/08/2021 1026   K 4.7 12/08/2021 1026   CL 102 12/08/2021 1026   CO2 31 12/08/2021 1026   GLUCOSE 100 (H) 12/08/2021 1026   BUN 18 12/08/2021 1026   CREATININE 0.89 12/08/2021 1026   CALCIUM 9.6 12/08/2021 1026    BNP No results found for: "BNP"  ProBNP No results found for: "PROBNP"  Imaging: No results found.        No data to display          No results found for: "NITRICOXIDE"      Assessment & Plan:   Obstructive sleep apnea Excellent control compliance on CPAP.  Plan  Patient Instructions  Continue CPAP At bedtime, wear all night long .  work on healthy weight  Do not drive if sleepy  Healthy sleep regimen  Lung Cancer CT chest screening next week as planned.  Follow up with Dr. Elsworth Soho or Sharod Petsch NP in 1 year and  As needed       Former smoker Lung cancer screening program as discussed  Morbid obesity ([place]) Healthy weight  loss     Rexene Edison, NP 09/01/2022

## 2022-09-01 NOTE — Assessment & Plan Note (Signed)
Excellent control compliance on CPAP.  Plan  Patient Instructions  Continue CPAP At bedtime, wear all night long .  work on healthy weight  Do not drive if sleepy  Healthy sleep regimen  Lung Cancer CT chest screening next week as planned.  Follow up with Dr. Elsworth Soho or Jolee Critcher NP in 1 year and  As needed

## 2022-09-01 NOTE — Assessment & Plan Note (Signed)
Healthy weight loss 

## 2022-09-08 ENCOUNTER — Encounter: Payer: Self-pay | Admitting: Acute Care

## 2022-09-08 ENCOUNTER — Ambulatory Visit (INDEPENDENT_AMBULATORY_CARE_PROVIDER_SITE_OTHER): Payer: Medicare Other | Admitting: Acute Care

## 2022-09-08 DIAGNOSIS — Z87891 Personal history of nicotine dependence: Secondary | ICD-10-CM | POA: Diagnosis not present

## 2022-09-08 NOTE — Progress Notes (Signed)
Virtual Visit via Telephone Note  I connected with Jesse [name] on 09/08/22 at 11:30 AM EDT by telephone and verified that I am speaking with the correct person using two identifiers.  Location: Patient:  At home Provider:  [place], [place], [place], [address]    I discussed the limitations, risks, security and privacy concerns of performing an evaluation and management service by telephone and the availability of in person appointments. I also discussed with the patient that there may be a patient responsible charge related to this service. The patient expressed understanding and agreed to proceed.    Shared Decision Making Visit Lung Cancer Screening Program [phone])   Eligibility: Age 71 y.o. Pack Years Smoking History Calculation 22 pack year smoking history (# packs/per year x # years smoked) Recent History of coughing up blood  no Unexplained weight loss? no ( >Than 15 pounds within the last 6 months ) Prior History Lung / other cancer no (Diagnosis within the last 5 years already requiring surveillance chest CT Scans). Smoking Status Former Smoker Former Smokers: Years since quit: 11 years  Quit Date: 2012  Visit Components: Discussion included one or more decision making aids. yes Discussion included risk/benefits of screening. yes Discussion included potential follow up diagnostic testing for abnormal scans. yes Discussion included meaning and risk of over diagnosis. yes Discussion included meaning and risk of False Positives. yes Discussion included meaning of total radiation exposure. yes  Counseling Included: Importance of adherence to annual lung cancer LDCT screening. yes Impact of comorbidities on ability to participate in the program. yes Ability and willingness to under diagnostic treatment. yes  Smoking Cessation Counseling: Current Smokers:  Discussed importance of smoking cessation. yes Information about tobacco cessation classes and  interventions provided to patient. yes Patient provided with "ticket" for LDCT Scan. yes Symptomatic Patient. no  Counseling NA Diagnosis Code: Tobacco Use Z72.0 Asymptomatic Patient yes  Counseling (Intermediate counseling: > three minutes counseling) C9470 Former Smokers:  Discussed the importance of maintaining cigarette abstinence. yes Diagnosis Code: Personal History of Nicotine Dependence. J62.836 Information about tobacco cessation classes and interventions provided to patient. Yes Patient provided with "ticket" for LDCT Scan. yes Written Order for Lung Cancer Screening with LDCT placed in Epic. Yes (CT Chest Lung Cancer Screening Low Dose W/O CM) [id number] Z12.2-Screening of respiratory organs Z87.891-Personal history of nicotine dependence  I spent 25 minutes of face to face time/virtual visit time  with  Jesse [name] discussing the risks and benefits of lung cancer screening. We took the time to pause the power point at intervals to allow for questions to be asked and answered to ensure understanding. We discussed that he had taken the single most powerful action possible to decrease his risk of developing lung cancer when he quit smoking. I counseled him to remain smoke free, and to contact me if he ever had the desire to smoke again so that I can provide resources and tools to help support the effort to remain smoke free. We discussed the time and location of the scan, and that either  Doroteo Glassman RN, Joella Prince, RN or I  or I will call / send a letter with the results within  24-72 hours of receiving them. He has the office contact information in the event he needs to speak with me,  he verbalized understanding of all of the above and had no further questions upon leaving the office.     I explained to the patient that there has  been a high incidence of coronary artery disease noted on these exams. I explained that this is a non-gated exam therefore degree or severity cannot be  determined. This patient is not on statin therapy. I have asked the patient to follow-up with their PCP regarding any incidental finding of coronary artery disease and management with diet or medication as they feel is clinically indicated. The patient verbalized understanding of the above and had no further questions.     Magdalen Spatz, NP 09/08/2022

## 2022-09-08 NOTE — Patient Instructions (Signed)

## 2022-09-09 ENCOUNTER — Ambulatory Visit
Admission: RE | Admit: 2022-09-09 | Discharge: 2022-09-09 | Disposition: A | Discharge status: Home / Self Care | Payer: Medicare Other | Source: Ambulatory Visit | Attending: Adult Health | Admitting: Adult Health

## 2022-09-09 DIAGNOSIS — F17210 Nicotine dependence, cigarettes, uncomplicated: Secondary | ICD-10-CM | POA: Diagnosis not present

## 2022-09-09 DIAGNOSIS — Z122 Encounter for screening for malignant neoplasm of respiratory organs: Secondary | ICD-10-CM

## 2022-09-09 DIAGNOSIS — Z87891 Personal history of nicotine dependence: Secondary | ICD-10-CM

## 2022-09-13 ENCOUNTER — Other Ambulatory Visit: Payer: Self-pay | Admitting: Acute Care

## 2022-09-13 DIAGNOSIS — Z87891 Personal history of nicotine dependence: Secondary | ICD-10-CM

## 2022-09-13 DIAGNOSIS — Z122 Encounter for screening for malignant neoplasm of respiratory organs: Secondary | ICD-10-CM

## 2022-09-13 DIAGNOSIS — F17210 Nicotine dependence, cigarettes, uncomplicated: Secondary | ICD-10-CM

## 2022-10-01 ENCOUNTER — Encounter: Payer: Self-pay | Admitting: Internal Medicine

## 2022-10-01 MED ORDER — SERTRALINE HCL 100 MG PO TABS: 100.0000 mg | ORAL_TABLET | Freq: Every day | ORAL | 1 refills | Status: DC

## 2022-10-27 DIAGNOSIS — G4733 Obstructive sleep apnea (adult) (pediatric): Secondary | ICD-10-CM | POA: Diagnosis not present

## 2022-12-08 ENCOUNTER — Telehealth: Payer: Self-pay

## 2022-12-08 NOTE — Telephone Encounter (Signed)
Spoke with pt about scheduling his CPE with Dr. Sharlet Salina and AWV with the [facility], patient states that he does not have his calendar and he will call back to schedule.    Last AWV  12/08/21 Last CPE 12/08/21  Please schedule at anytime with [facility] if patient calls the office back.  Also schedule CPE with Dr. Sharlet Salina.  30 Minutes appointment   Any questions, please call me at [phone]

## 2022-12-20 ENCOUNTER — Encounter: Payer: Self-pay | Admitting: Internal Medicine

## 2022-12-22 ENCOUNTER — Other Ambulatory Visit: Payer: Self-pay | Admitting: Internal Medicine

## 2022-12-22 ENCOUNTER — Other Ambulatory Visit (INDEPENDENT_AMBULATORY_CARE_PROVIDER_SITE_OTHER): Payer: Medicare Other

## 2022-12-22 ENCOUNTER — Encounter: Payer: Medicare Other | Admitting: Internal Medicine

## 2022-12-22 DIAGNOSIS — E785 Hyperlipidemia, unspecified: Secondary | ICD-10-CM

## 2022-12-22 DIAGNOSIS — N400 Enlarged prostate without lower urinary tract symptoms: Secondary | ICD-10-CM

## 2022-12-22 LAB — CBC
HCT: 49 % (ref 39.0–52.0)
Hemoglobin: 16.2 g/dL (ref 13.0–17.0)
MCHC: 33.1 g/dL (ref 30.0–36.0)
MCV: 84.8 fl (ref 78.0–100.0)
Platelets: 265 10*3/uL (ref 150.0–400.0)
RBC: 5.78 Mil/uL (ref 4.22–5.81)
RDW: 14.7 % (ref 11.5–15.5)
WBC: 7.2 10*3/uL (ref 4.0–10.5)

## 2022-12-22 LAB — COMPREHENSIVE METABOLIC PANEL
ALT: 44 U/L (ref 0–53)
AST: 35 U/L (ref 0–37)
Albumin: 4.6 g/dL (ref 3.5–5.2)
Alkaline Phosphatase: 57 U/L (ref 39–117)
BUN: 17 mg/dL (ref 6–23)
CO2: 28 mEq/L (ref 19–32)
Calcium: 9.4 mg/dL (ref 8.4–10.5)
Chloride: 101 mEq/L (ref 96–112)
Creatinine, Ser: 0.85 mg/dL (ref 0.40–1.50)
GFR: 87.83 mL/min (ref >60.00)
Glucose, Bld: 97 mg/dL (ref 70–99)
Potassium: 4.9 mEq/L (ref 3.5–5.1)
Sodium: 140 mEq/L (ref 135–145)
Total Bilirubin: 0.8 mg/dL (ref 0.2–1.2)
Total Protein: 7.5 g/dL (ref 6.0–8.3)

## 2022-12-22 LAB — LIPID PANEL
Cholesterol: 200 mg/dL (ref 0–200)
HDL: 41.8 mg/dL (ref >39.00)
LDL Cholesterol: 132 mg/dL — ABNORMAL HIGH (ref 0–99)
NonHDL: 157.94
Total CHOL/HDL Ratio: 5
Triglycerides: 129 mg/dL (ref 0.0–149.0)
VLDL: 25.8 mg/dL (ref 0.0–40.0)

## 2022-12-22 LAB — PSA: PSA: 5 ng/mL — ABNORMAL HIGH (ref 0.10–4.00)

## 2022-12-22 LAB — HEMOGLOBIN A1C: Hgb A1c MFr Bld: 6.3 % (ref 4.6–6.5)

## 2022-12-23 ENCOUNTER — Encounter: Payer: Self-pay | Admitting: Internal Medicine

## 2022-12-23 ENCOUNTER — Ambulatory Visit (INDEPENDENT_AMBULATORY_CARE_PROVIDER_SITE_OTHER): Payer: Medicare Other

## 2022-12-23 ENCOUNTER — Ambulatory Visit (INDEPENDENT_AMBULATORY_CARE_PROVIDER_SITE_OTHER): Payer: Medicare Other | Admitting: Internal Medicine

## 2022-12-23 VITALS — BP 160/100 | HR 87 | Temp 98.3°F | Ht 72.0 in | Wt 288.0 lb

## 2022-12-23 DIAGNOSIS — F3342 Major depressive disorder, recurrent, in full remission: Secondary | ICD-10-CM

## 2022-12-23 DIAGNOSIS — M542 Cervicalgia: Secondary | ICD-10-CM | POA: Diagnosis not present

## 2022-12-23 DIAGNOSIS — M436 Torticollis: Secondary | ICD-10-CM

## 2022-12-23 DIAGNOSIS — Z87891 Personal history of nicotine dependence: Secondary | ICD-10-CM

## 2022-12-23 DIAGNOSIS — E785 Hyperlipidemia, unspecified: Secondary | ICD-10-CM | POA: Diagnosis not present

## 2022-12-23 DIAGNOSIS — Z0000 Encounter for general adult medical examination without abnormal findings: Secondary | ICD-10-CM | POA: Diagnosis not present

## 2022-12-23 NOTE — Progress Notes (Signed)
Subjective:   Patient ID: Jesse [name], male    DOB: Jun 21, 1951, 72 y.o.   MRN: [id number]  HPI Here for medicare wellness and physical, no new complaints. Please see A/P for status and treatment of chronic medical problems.   Diet: heart healthy Physical activity: sedentary Depression/mood screen: negative Hearing: intact to whispered voice with bilateral aids Visual acuity: grossly normal, performs annual eye exam  ADLs: capable Fall risk: none Home safety: good Cognitive evaluation: intact to orientation, naming, recall and repetition EOL planning: adv directives discussed  [place] Office Visit from 12/23/2022 in [place] at [place] Office Visit from 12/23/2022 in [place] at [facility]  PHQ-9 Total Score 0         08/28/2018    8:39 AM 10/27/2020   10:29 AM 12/08/2021    9:37 AM 08/04/2022    8:55 AM 12/23/2022    9:07 AM  [place] in the past year? No 0 0 0 0  Was there an injury with Fall?  0 0 0 0  Fall Risk Category Calculator  0 0 0 0  Fall Risk Category (Retired)  Low Low Low   (RETIRED) Patient Fall Risk Level  Low fall risk       I have personally reviewed and have noted 1. The patient's medical and social history - reviewed today no changes 2. Their use of alcohol, tobacco or illicit drugs 3. Their current medications and supplements 4. The patient's functional ability including ADL's, fall risks, home safety risks and hearing or visual impairment. 5. Diet and physical activities 6. Evidence for depression or mood disorders 7. Care team reviewed and updated 8.  The patient is not on an opioid pain medication.  Patient Care Team: Hoyt Koch, MD as PCP - General (Internal Medicine) Rigoberto Noel, MD as Consulting Physician (Pulmonary Disease) Past Medical History:  Diagnosis Date   Arthritis    Chicken pox    Diverticulitis     Hyperlipidemia    Sleep apnea    54yrago positive sleep test---never treated   Past Surgical History:  Procedure Laterality Date   CARPAL TUNNEL RELEASE  11/15/2002   right   CATARACT EXTRACTION, BILATERAL  11/15/2012   right, 4/29; left, 3/8   FINGER SURGERY Left 2018   KNEE ARTHROSCOPY  11/16/2007   left   TONSILLECTOMY  11/15/1954   Family History  Problem Relation Age of Onset   Breast cancer Mother    Cancer Mother        ovary,breast   Alcohol abuse Father    Heart attack Father    Heart disease Father    Cancer Sister        uterus   Cancer Brother    Diabetes Paternal Grandmother    Heart attack Paternal Grandfather    Hyperlipidemia Paternal Grandfather    Colon polyps Cousin    Colon cancer Cousin    Esophageal cancer Neg Hx    Stomach cancer Neg Hx    Rectal cancer Neg Hx    Review of Systems  Constitutional: Negative.   HENT: Negative.    Eyes: Negative.   Respiratory:  Negative for cough, chest tightness and shortness of breath.   Cardiovascular:  Negative for chest pain, palpitations and leg swelling.  Gastrointestinal:  Negative for abdominal distention, abdominal pain, constipation, diarrhea, nausea and vomiting.  Musculoskeletal: Negative.   Skin: Negative.   Neurological: Negative.   Psychiatric/Behavioral: Negative.      Objective:  Physical Exam Constitutional:      Appearance: He is well-developed.  HENT:     Head: Normocephalic and atraumatic.  Cardiovascular:     Rate and Rhythm: Normal rate and regular rhythm.  Pulmonary:     Effort: Pulmonary effort is normal. No respiratory distress.     Breath sounds: Normal breath sounds. No wheezing or rales.  Abdominal:     General: Bowel sounds are normal. There is no distension.     Palpations: Abdomen is soft.     Tenderness: There is no abdominal tenderness. There is no rebound.  Musculoskeletal:     Cervical back: Normal range of motion.  Skin:    General: Skin is warm and dry.   Neurological:     Mental Status: He is alert and oriented to person, place, and time.     Coordination: Coordination normal.     Vitals:   12/23/22 0902 12/23/22 0907  BP: (!) 160/100 (!) 160/100  Pulse: 87   Temp: 98.3 F (36.8 C)   TempSrc: Oral   SpO2: 94%   Weight: 288 lb (130.6 kg)   Height: 6' (1.829 m)     Assessment & Plan:

## 2022-12-23 NOTE — Patient Instructions (Addendum)
Get the shingles at the pharmacy. We will do the x-ray of the neck.  Check the blood pressure once a week and let us know if >140/90.

## 2022-12-24 ENCOUNTER — Encounter: Payer: Self-pay | Admitting: Internal Medicine

## 2022-12-24 DIAGNOSIS — M436 Torticollis: Secondary | ICD-10-CM | POA: Insufficient documentation

## 2022-12-24 NOTE — Assessment & Plan Note (Signed)
Recent lipid panel stable.

## 2022-12-24 NOTE — Assessment & Plan Note (Signed)
Stable on zoloft 100 mg daily. Does not want to stop will continue.

## 2022-12-24 NOTE — Assessment & Plan Note (Signed)
Continue lung cancer screening last Oct 2023.

## 2022-12-24 NOTE — Assessment & Plan Note (Signed)
Flu shot up to date. Covid-19 counseled. Pneumonia complete. Shingrix counseled due at pharmacy. Tetanus due 2028. Colonoscopy due 2026. Counseled about sun safety and mole surveillance. Counseled about the dangers of distracted driving. Given 10 year screening recommendations.

## 2022-12-24 NOTE — Assessment & Plan Note (Signed)
Present for years and gradually progressive. Checking x-ray cervical spine at visit and offered PT.

## 2022-12-24 NOTE — Assessment & Plan Note (Signed)
BMI 39 and complicated by depression and hyperlipidemia. Counseled about diet and exercise eating more fast food lately he will cut back.

## 2022-12-26 DIAGNOSIS — G4733 Obstructive sleep apnea (adult) (pediatric): Secondary | ICD-10-CM | POA: Diagnosis not present

## 2022-12-29 DIAGNOSIS — K080 Exfoliation of teeth due to systemic causes: Secondary | ICD-10-CM | POA: Diagnosis not present

## 2023-01-05 DIAGNOSIS — H524 Presbyopia: Secondary | ICD-10-CM | POA: Diagnosis not present

## 2023-01-05 DIAGNOSIS — H5022 Vertical strabismus, left eye: Secondary | ICD-10-CM | POA: Diagnosis not present

## 2023-01-05 DIAGNOSIS — H532 Diplopia: Secondary | ICD-10-CM | POA: Diagnosis not present

## 2023-01-05 DIAGNOSIS — Z961 Presence of intraocular lens: Secondary | ICD-10-CM | POA: Diagnosis not present

## 2023-01-24 DIAGNOSIS — G4733 Obstructive sleep apnea (adult) (pediatric): Secondary | ICD-10-CM | POA: Diagnosis not present

## 2023-01-25 DIAGNOSIS — G4733 Obstructive sleep apnea (adult) (pediatric): Secondary | ICD-10-CM | POA: Diagnosis not present

## 2023-02-25 ENCOUNTER — Encounter: Payer: Self-pay | Admitting: Adult Health

## 2023-03-14 ENCOUNTER — Telehealth: Payer: Self-pay

## 2023-03-14 NOTE — Telephone Encounter (Signed)
Called patient to schedule Medicare Annual Wellness Visit (AWV). Unable to reach patient.  Last date of AWV: 12/23/21  Please schedule an appointment at any time On Annual Wellness Visit Schedule.

## 2023-03-30 DIAGNOSIS — K080 Exfoliation of teeth due to systemic causes: Secondary | ICD-10-CM | POA: Diagnosis not present

## 2023-06-20 ENCOUNTER — Ambulatory Visit: Payer: Medicare Other | Admitting: Adult Health

## 2023-06-21 ENCOUNTER — Ambulatory Visit: Payer: Medicare Other | Admitting: Adult Health

## 2023-07-07 ENCOUNTER — Encounter: Payer: Self-pay | Admitting: Adult Health

## 2023-07-07 ENCOUNTER — Ambulatory Visit: Payer: Medicare Other | Admitting: Adult Health

## 2023-07-07 VITALS — BP 126/80 | HR 83 | Ht 72.5 in | Wt 295.4 lb

## 2023-07-07 DIAGNOSIS — G4733 Obstructive sleep apnea (adult) (pediatric): Secondary | ICD-10-CM | POA: Diagnosis not present

## 2023-07-07 DIAGNOSIS — Z87891 Personal history of nicotine dependence: Secondary | ICD-10-CM | POA: Diagnosis not present

## 2023-07-07 NOTE — Assessment & Plan Note (Signed)
Continue with yearly LDCT chest , due in Oct 2024 .

## 2023-07-07 NOTE — Assessment & Plan Note (Addendum)
Excellent control and compliance on CPAP  continue on current settings  Plan  Patient Instructions  Continue CPAP At bedtime, wear all night long .  Work on healthy weight  Do not drive if sleepy  Healthy sleep regimen  Lung Cancer CT chest screening in October as planned.  Follow up with Dr. Vassie Loll or Severin Bou NP in 1 year and  As needed

## 2023-07-07 NOTE — Progress Notes (Signed)
@Patient  ID: Jesse [name], male    DOB: 07-Feb-1951, 72 y.o.   MRN: [id number]  Chief Complaint  Patient presents with   Follow-up    Referring provider: Myrlene Broker, *  HPI: 72 year old male seen for sleep consult June 17, 2022 to reestablish for sleep apnea Former patient of Dr. Vassie Loll Participates in the lung cancer CT screening program TEST/EVENTS :  NPSG 07/2013:  AHI 67/hr   07/07/2023 Follow up ; OSA  Patient presents for a 1 year follow-up.  Patient has underlying severe sleep apnea is on nocturnal CPAP.  Patient says that he is doing well on CPAP.  Cannot sleep without it.  Feels that he benefits from CPAP with decreased daytime sleepiness.  He uses the [employer] full facemask.  CPAP download shows excellent compliance with 100% usage. Uses old machine when he travels out of town   Daily average usage at 6  hours.  Patient is on auto CPAP 16 to 19 cm H2O.  And AHI is 4.6hr .   Allergies  Allergen Reactions   Penicillins Swelling    Causes severe facial swelling    Immunization History  Administered Date(s) Administered   Fluad Quad(high Dose 65+) 09/04/2019, 10/27/2020, 09/11/2021, 08/04/2022   Influenza Whole 09/15/2012   Influenza, High Dose Seasonal PF 08/22/2017, 08/28/2018   Influenza,inj,Quad PF,6+ Mos 08/31/2013, 09/30/2014, 08/11/2015, 08/17/2016   PFIZER(Purple Top)SARS-COV-2 Vaccination 12/30/2019, 01/22/2020, 08/05/2020   Pneumococcal Conjugate-13 08/28/2018   Pneumococcal Polysaccharide-23 09/04/2019   Tdap 08/17/2016, 09/16/2017    Past Medical History:  Diagnosis Date   Arthritis    Chicken pox    Diverticulitis    Hyperlipidemia    Sleep apnea    21yr ago positive sleep test---never treated    Tobacco History: Social History   Tobacco Use  Smoking Status Former   Current packs/day: 0.00   Average packs/day: 1 pack/day for 42.0 years (42.0 ttl pk-yrs)   Types: Cigarettes   Start date: 10/15/1969   Quit date: 10/16/2011    Years since quitting: 11.7  Smokeless Tobacco Never  Tobacco Comments   SMOKED 42 YRS "OFF AND ON"   Counseling given: Not Answered Tobacco comments: SMOKED 42 YRS "OFF AND ON"   Outpatient Medications Prior to Visit  Medication Sig Dispense Refill   aspirin 325 MG tablet Take 325 mg by mouth daily.     Ibuprofen 200 MG CAPS Take 1 capsule by mouth 2 (two) times daily.     sertraline (ZOLOFT) 100 MG tablet Take 1 tablet (100 mg total) by mouth daily. 90 tablet 1   SODIUM FLUORIDE 5000 PLUS 1.1 % CREA dental cream See admin instructions.     No facility-administered medications prior to visit.     Review of Systems:   Constitutional:   No  weight loss, night sweats,  Fevers, chills, fatigue, or  lassitude.  HEENT:   No headaches,  Difficulty swallowing,  Tooth/dental problems, or  Sore throat,                No sneezing, itching, ear ache, nasal congestion, post nasal drip,   CV:  No chest pain,  Orthopnea, PND, swelling in lower extremities, anasarca, dizziness, palpitations, syncope.   GI  No heartburn, indigestion, abdominal pain, nausea, vomiting, diarrhea, change in bowel habits, loss of appetite, bloody stools.   Resp: No shortness of breath with exertion or at rest.  No excess mucus, no productive cough,  No non-productive cough,  No coughing up of blood.  No change in color of mucus.  No wheezing.  No chest wall deformity  Skin: no rash or lesions.  GU: no dysuria, change in color of urine, no urgency or frequency.  No flank pain, no hematuria   MS:  No joint pain or swelling.  No decreased range of motion.  No back pain.    Physical Exam  BP 126/80 (BP Location: Left Arm, Patient Position: Sitting, Cuff Size: Large)   Pulse 83   Ht 6' 0.5" (1.842 m)   Wt 295 lb 6.4 oz (134 kg)   SpO2 95%   BMI 39.51 kg/m   GEN: A/Ox3; pleasant , NAD, well nourished    HEENT:  [place]/AT,   NOSE-clear, THROAT-clear, no lesions, no postnasal drip or exudate noted. Class 3 MP  airway   NECK:  Supple w/ fair ROM; no JVD; normal carotid impulses w/o bruits; no thyromegaly or nodules palpated; no lymphadenopathy.    RESP  Clear  P & A; w/o, wheezes/ rales/ or rhonchi. no accessory muscle use, no dullness to percussion  CARD:  RRR, no m/r/g, no peripheral edema, pulses intact, no cyanosis or clubbing.  GI:   Soft & nt; nml bowel sounds; no organomegaly or masses detected.   Musco: Warm bil, no deformities or joint swelling noted.   Neuro: alert, no focal deficits noted.    Skin: Warm, no lesions or rashes    Lab Results: No results found for: "BNP"  ProBNP No results found for: "PROBNP"  Imaging: No results found.  Administration History     None           No data to display          No results found for: "NITRICOXIDE"      Assessment & Plan:   Obstructive sleep apnea Excellent control and compliance on CPAP  continue on current settings  Plan  Patient Instructions  Continue CPAP At bedtime, wear all night long .  Work on healthy weight  Do not drive if sleepy  Healthy sleep regimen  Lung Cancer CT chest screening in October as planned.  Follow up with Dr. Vassie Loll or Damian Hofstra NP in 1 year and  As needed      Former smoker Continue with yearly LDCT chest , due in Oct 2024 .      Rubye Oaks, NP 07/07/2023

## 2023-07-07 NOTE — Patient Instructions (Addendum)
Continue CPAP At bedtime, wear all night long .  Work on healthy weight  Do not drive if sleepy  Healthy sleep regimen  Lung Cancer CT chest screening in October as planned.  Follow up with Dr. Vassie Loll or Eliyahu Bille NP in 1 year and  As needed

## 2023-07-08 NOTE — Addendum Note (Signed)
Addended by: Delrae Rend on: 07/08/2023 09:05 AM   Modules accepted: Orders

## 2023-07-13 DIAGNOSIS — K080 Exfoliation of teeth due to systemic causes: Secondary | ICD-10-CM | POA: Diagnosis not present

## 2023-08-07 DIAGNOSIS — G4733 Obstructive sleep apnea (adult) (pediatric): Secondary | ICD-10-CM | POA: Diagnosis not present

## 2023-09-06 DIAGNOSIS — G4733 Obstructive sleep apnea (adult) (pediatric): Secondary | ICD-10-CM | POA: Diagnosis not present

## 2023-09-12 ENCOUNTER — Inpatient Hospital Stay
Admission: RE | Admit: 2023-09-12 | Discharge: 2023-09-12 | Disposition: A | Discharge status: Home / Self Care | Payer: Medicare Other | Source: Ambulatory Visit | Attending: Internal Medicine | Admitting: Internal Medicine

## 2023-09-12 DIAGNOSIS — Z122 Encounter for screening for malignant neoplasm of respiratory organs: Secondary | ICD-10-CM

## 2023-09-12 DIAGNOSIS — Z87891 Personal history of nicotine dependence: Secondary | ICD-10-CM | POA: Diagnosis not present

## 2023-09-12 DIAGNOSIS — F17210 Nicotine dependence, cigarettes, uncomplicated: Secondary | ICD-10-CM

## 2023-09-30 NOTE — Telephone Encounter (Signed)
This is from the Lung Cancer Screening Program, Kandice Robinsons NP ordered it. It is taking longer and longer for final results to come through due to [employer] of Radiologist. Please let patient know as soon as the results come someone will reach out to him. We have no control over the timing for Radiology. I know it is frustrating /anxiety producing. Will forward to Maralyn Sago for [place] .

## 2023-10-17 ENCOUNTER — Other Ambulatory Visit: Payer: Self-pay

## 2023-10-17 DIAGNOSIS — Z87891 Personal history of nicotine dependence: Secondary | ICD-10-CM

## 2023-10-17 DIAGNOSIS — Z122 Encounter for screening for malignant neoplasm of respiratory organs: Secondary | ICD-10-CM

## 2023-10-20 DIAGNOSIS — G4733 Obstructive sleep apnea (adult) (pediatric): Secondary | ICD-10-CM | POA: Diagnosis not present

## 2023-11-20 DIAGNOSIS — G4733 Obstructive sleep apnea (adult) (pediatric): Secondary | ICD-10-CM | POA: Diagnosis not present

## 2023-12-02 ENCOUNTER — Ambulatory Visit (INDEPENDENT_AMBULATORY_CARE_PROVIDER_SITE_OTHER): Payer: Medicare Other

## 2023-12-02 DIAGNOSIS — Z0000 Encounter for general adult medical examination without abnormal findings: Secondary | ICD-10-CM

## 2023-12-02 NOTE — Progress Notes (Signed)
Subjective:   Jesse [name] is a 73 y.o. male who presents for Medicare Annual/Subsequent preventive examination.  Visit Complete: Virtual I connected with  Jesse [name] on 12/02/23 by a video and audio enabled telemedicine application and verified that I am speaking with the correct person using two identifiers.  Patient Location: Home  Provider Location: Home Office  I discussed the limitations of evaluation and management by telemedicine. The patient expressed understanding and agreed to proceed.  Vital Signs: Because this visit was a virtual/telehealth visit, some criteria may be missing or patient reported. Any vitals not documented were not able to be obtained and vitals that have been documented are patient reported.  Patient Medicare AWV questionnaire was completed by the patient on 11/29/2023; I have confirmed that all information answered by patient is correct and no changes since this date.  Cardiac Risk Factors include: advanced age (>74men, >31 women);dyslipidemia;male gender     Objective:    Today's Vitals   There is no height or weight on file to calculate BMI.     12/02/2023    1:09 PM 02/12/2021   10:37 AM 09/16/2017    2:47 PM  Advanced Directives  Does Patient Have a Medical Advance Directive? Yes No No  Type of Estate agent of [place];Living will    Copy of Healthcare Power of Attorney in Chart? No - copy requested    Would patient like information on creating a medical advance directive?  No - Patient declined No - Patient declined    Current Medications (verified) Outpatient Encounter Medications as of 12/02/2023  Medication Sig   aspirin 325 MG tablet Take 325 mg by mouth daily.   Ibuprofen 200 MG CAPS Take 1 capsule by mouth 2 (two) times daily.   sertraline (ZOLOFT) 100 MG tablet Take 1 tablet (100 mg total) by mouth daily.   SODIUM FLUORIDE 5000 PLUS 1.1 % CREA dental cream See admin instructions. (Patient not  taking: Reported on 12/02/2023)   No facility-administered encounter medications on file as of 12/02/2023.    Allergies (verified) Penicillins   History: Past Medical History:  Diagnosis Date   Arthritis    Chicken pox    Diverticulitis    Hyperlipidemia    Sleep apnea    38yr ago positive sleep test---never treated   Past Surgical History:  Procedure Laterality Date   CARPAL TUNNEL RELEASE  11/15/2002   right   CATARACT EXTRACTION, BILATERAL  11/15/2012   right, 4/29; left, 3/8   FINGER SURGERY Left 2018   KNEE ARTHROSCOPY  11/16/2007   left   TONSILLECTOMY  11/15/1954   Family History  Problem Relation Age of Onset   Breast cancer Mother    Cancer Mother        ovary,breast   Alcohol abuse Father    Heart attack Father    Heart disease Father    Cancer Sister        uterus   Cancer Brother    Diabetes Paternal Grandmother    Heart attack Paternal Grandfather    Hyperlipidemia Paternal Grandfather    Colon polyps Cousin    Colon cancer Cousin    Esophageal cancer Neg Hx    Stomach cancer Neg Hx    Rectal cancer Neg Hx    Social History   Socioeconomic History   Marital status: Married    Spouse name: Not on file   Number of children: Not on file   Years of education: Not  on file   Highest education level: Associate degree: occupational, technical, or vocational program  Occupational History   Not on file  Tobacco Use   Smoking status: Former    Current packs/day: 0.00    Average packs/day: 1 pack/day for 42.0 years (42.0 ttl pk-yrs)    Types: Cigarettes    Start date: 10/15/1969    Quit date: 10/16/2011    Years since quitting: 12.1   Smokeless tobacco: Never   Tobacco comments:    SMOKED 42 YRS "OFF AND ON"  Vaping Use   Vaping status: Never Used  Substance and Sexual Activity   Alcohol use: Yes    Comment: occ.   Drug use: No   Sexual activity: Not on file  Other Topics Concern   Not on file  Social History Narrative   Not on file    Social Drivers of Health   Financial Resource Strain: Low Risk  (11/29/2023)   Overall Financial Resource Strain (CARDIA)    Difficulty of Paying Living Expenses: Not hard at all  Food Insecurity: No Food Insecurity (11/29/2023)   Hunger Vital Sign    Worried About Running Out of Food in the Last Year: Never true    Ran Out of Food in the Last Year: Never true  Transportation Needs: No Transportation Needs (11/29/2023)   PRAPARE - Administrator, Civil Service (Medical): No    Lack of Transportation (Non-Medical): No  Physical Activity: Inactive (11/29/2023)   Exercise Vital Sign    Days of Exercise per Week: 0 days    Minutes of Exercise per Session: 0 min  Stress: No Stress Concern Present (11/29/2023)   [employer] of Occupational Health - Occupational Stress Questionnaire    Feeling of Stress : Not at all  Social Connections: Moderately Integrated (11/29/2023)   Social Connection and Isolation Panel [NHANES]    Frequency of Communication with Friends and Family: Once a week    Frequency of Social Gatherings with Friends and Family: Never    Attends Religious Services: 1 to 4 times per year    Active Member of [employer] or Organizations: Yes    Attends Banker Meetings: Never    Marital Status: Married    Tobacco Counseling Counseling given: Not Answered Tobacco comments: SMOKED 42 YRS "OFF AND ON"   Clinical Intake:  Pre-visit preparation completed: Yes  Pain : No/denies pain     Nutritional Risks: None Diabetes: No  How often do you need to have someone help you when you read instructions, pamphlets, or other written materials from your doctor or pharmacy?: 1 - Never  Interpreter Needed?: No  Information entered by :: NAllen LPN   Activities of Daily Living    11/29/2023    4:43 PM  In your present state of health, do you have any difficulty performing the following activities:  Hearing? 1  Comment wears hearing aids  Vision? 0   Difficulty concentrating or making decisions? 0  Walking or climbing stairs? 0  Dressing or bathing? 0  Doing errands, shopping? 0  Preparing Food and eating ? N  Using the Toilet? N  In the past six months, have you accidently leaked urine? N  Do you have problems with loss of bowel control? N  Managing your Medications? N  Managing your Finances? N  Housekeeping or managing your Housekeeping? N    Patient Care Team: Myrlene Broker, MD as PCP - General (Internal Medicine) Oretha Milch, MD as  Consulting Physician (Pulmonary Disease)  Indicate any recent Medical Services you may have received from other than [facility] providers in the past year (date may be approximate).     Assessment:   This is a routine wellness examination for Martae.  Hearing/Vision screen Hearing Screening - Comments:: Has hearing aids that are maintained Vision Screening - Comments:: Regular eye exams, [facility]   Goals Addressed             This Visit's Progress    Patient Stated       12/02/2023, wants to lose weight       Depression Screen    12/02/2023    1:10 PM 12/23/2022    9:07 AM 08/04/2022    8:54 AM 12/08/2021    9:37 AM 10/27/2020   10:33 AM 10/27/2020   10:29 AM 09/04/2019    2:13 PM  PHQ 2/9 Scores  PHQ - 2 Score 1 0 0 0 0 0 0  PHQ- 9 Score  0 1 0 4  2    Fall Risk    11/29/2023    4:43 PM 12/23/2022    9:07 AM 08/04/2022    8:55 AM 12/08/2021    9:37 AM 10/27/2020   10:29 AM  Fall Risk   Falls in the past year? 0 0 0 0 0  Number falls in past yr: 0 0 0 0 0  Injury with Fall? 0 0 0 0 0  Risk for fall due to : Medication side effect      Follow up Falls prevention discussed;Falls evaluation completed        MEDICARE RISK AT HOME: Medicare Risk at Home Any stairs in or around the home?: (Patient-Rptd) No If so, are there any without handrails?: (Patient-Rptd) No Home free of loose throw rugs in walkways, pet beds, electrical cords, etc?:  (Patient-Rptd) No Adequate lighting in your home to reduce risk of falls?: (Patient-Rptd) Yes Life alert?: (Patient-Rptd) No Use of a cane, walker or w/c?: (Patient-Rptd) No Grab bars in the bathroom?: (Patient-Rptd) Yes Shower chair or bench in shower?: (Patient-Rptd) Yes Elevated toilet seat or a handicapped toilet?: (Patient-Rptd) No  TIMED UP AND GO:  Was the test performed?  No    Cognitive Function:        12/02/2023    1:11 PM  6CIT Screen  What Year? 0 points  What month? 0 points  What time? 0 points  Count back from 20 0 points  Months in reverse 0 points  Repeat phrase 2 points  Total Score 2 points    Immunizations Immunization History  Administered Date(s) Administered   Fluad Quad(high Dose 65+) 09/04/2019, 10/27/2020, 09/11/2021, 08/04/2022   Influenza Whole 09/15/2012   Influenza, High Dose Seasonal PF 08/22/2017, 08/28/2018   Influenza,inj,Quad PF,6+ Mos 08/31/2013, 09/30/2014, 08/11/2015, 08/17/2016   PFIZER(Purple Top)SARS-COV-2 Vaccination 12/30/2019, 01/22/2020, 08/05/2020   Pneumococcal Conjugate-13 08/28/2018   Pneumococcal Polysaccharide-23 09/04/2019   Tdap 08/17/2016, 09/16/2017    TDAP status: Up to date  Flu Vaccine status: Due, Education has been provided regarding the importance of this vaccine. Advised may receive this vaccine at local pharmacy or Health Dept. Aware to provide a copy of the vaccination record if obtained from local pharmacy or Health Dept. Verbalized acceptance and understanding.  Pneumococcal vaccine status: Up to date  Covid-19 vaccine status: Information provided on how to obtain vaccines.   Qualifies for Shingles Vaccine? Yes   Zostavax completed No   Shingrix Completed?: No.  Education has been provided regarding the importance of this vaccine. Patient has been advised to call insurance company to determine out of pocket expense if they have not yet received this vaccine. Advised may also receive vaccine at local  pharmacy or Health Dept. Verbalized acceptance and understanding.  Screening Tests Health Maintenance  Topic Date Due   Zoster Vaccines- Shingrix (1 of 2) Never done   INFLUENZA VACCINE  06/16/2023   COVID-19 Vaccine (4 - 2024-25 season) 07/17/2023   Lung Cancer Screening  09/11/2024   Medicare Annual Wellness (AWV)  12/01/2024   Colonoscopy  04/28/2025   DTaP/Tdap/Td (3 - Td or Tdap) 09/17/2027   Pneumonia Vaccine 16+ Years old  Completed   Hepatitis C Screening  Completed   HPV VACCINES  Aged Out   Fecal DNA (Cologuard)  Discontinued    Health Maintenance  Health Maintenance Due  Topic Date Due   Zoster Vaccines- Shingrix (1 of 2) Never done   INFLUENZA VACCINE  06/16/2023   COVID-19 Vaccine (4 - 2024-25 season) 07/17/2023    Colorectal cancer screening: Type of screening: Colonoscopy. Completed 04/28/2022. Repeat every 3 years  Lung Cancer Screening: (Low Dose CT Chest recommended if Age 64-80 years, 20 pack-year currently smoking OR have quit w/in 15years.) does qualify.   Lung Cancer Screening Referral: CT scan 09/12/2023  Additional Screening:  Hepatitis C Screening: does qualify; Completed 08/17/2016  Vision Screening: Recommended annual ophthalmology exams for early detection of glaucoma and other disorders of the eye. Is the patient up to date with their annual eye exam?  Yes  Who is the provider or what is the name of the office in which the patient attends annual eye exams? [facility] If pt is not established with a provider, would they like to be referred to a provider to establish care? No .   Dental Screening: Recommended annual dental exams for proper oral hygiene  Diabetic Foot Exam: n/a  Community Resource Referral / Chronic Care Management: CRR required this visit?  No   CCM required this visit?  No     Plan:     I have personally reviewed and noted the following in the patient's chart:   Medical and social history Use of alcohol,  tobacco or illicit drugs  Current medications and supplements including opioid prescriptions. Patient is not currently taking opioid prescriptions. Functional ability and status Nutritional status Physical activity Advanced directives List of other physicians Hospitalizations, surgeries, and ER visits in previous 12 months Vitals Screenings to include cognitive, depression, and falls Referrals and appointments  In addition, I have reviewed and discussed with patient certain preventive protocols, quality metrics, and best practice recommendations. A written personalized care plan for preventive services as well as general preventive health recommendations were provided to patient.     Barb Merino, LPN   05/05/3085   After Visit Summary: (MyChart) Due to this being a telephonic visit, the after visit summary with patients personalized plan was offered to patient via MyChart   Nurse Notes: none

## 2023-12-02 NOTE — Patient Instructions (Signed)
Jesse [name] , Thank you for taking time to come for your Medicare Wellness Visit. I appreciate your ongoing commitment to your health goals. Please review the following plan we discussed and let me know if I can assist you in the future.   Referrals/Orders/Follow-Ups/Clinician Recommendations: none  This is a list of the screening recommended for you and due dates:  Health Maintenance  Topic Date Due   Zoster (Shingles) Vaccine (1 of 2) Never done   Flu Shot  06/16/2023   COVID-19 Vaccine (4 - 2024-25 season) 07/17/2023   Screening for Lung Cancer  09/11/2024   Medicare Annual Wellness Visit  12/01/2024   Colon Cancer Screening  04/28/2025   DTaP/Tdap/Td vaccine (3 - Td or Tdap) 09/17/2027   Pneumonia Vaccine  Completed   Hepatitis C Screening  Completed   HPV Vaccine  Aged Out   Cologuard (Stool DNA test)  Discontinued    Advanced directives: (Copy Requested) Please bring a copy of your health care power of attorney and living will to the office to be added to your chart at your convenience.  Next Medicare Annual Wellness Visit scheduled for next year: Yes  insert Preventive Care attachment Insert FALL PREVENTION attachment if needed

## 2023-12-06 DIAGNOSIS — K080 Exfoliation of teeth due to systemic causes: Secondary | ICD-10-CM | POA: Diagnosis not present

## 2023-12-11 DIAGNOSIS — G4733 Obstructive sleep apnea (adult) (pediatric): Secondary | ICD-10-CM | POA: Diagnosis not present

## 2023-12-21 ENCOUNTER — Encounter: Payer: Self-pay | Admitting: Internal Medicine

## 2023-12-21 DIAGNOSIS — G4733 Obstructive sleep apnea (adult) (pediatric): Secondary | ICD-10-CM | POA: Diagnosis not present

## 2023-12-22 ENCOUNTER — Other Ambulatory Visit: Payer: Self-pay

## 2023-12-22 MED ORDER — SERTRALINE HCL 100 MG PO TABS: 100.0000 mg | ORAL_TABLET | Freq: Every day | ORAL | 0 refills | Status: DC

## 2023-12-28 ENCOUNTER — Encounter: Payer: Medicare Other | Admitting: Internal Medicine

## 2024-01-04 DIAGNOSIS — G4733 Obstructive sleep apnea (adult) (pediatric): Secondary | ICD-10-CM | POA: Diagnosis not present

## 2024-01-06 ENCOUNTER — Ambulatory Visit: Payer: Medicare Other | Admitting: Internal Medicine

## 2024-01-06 ENCOUNTER — Encounter: Payer: Self-pay | Admitting: Internal Medicine

## 2024-01-06 VITALS — BP 128/80 | HR 87 | Temp 97.9°F | Ht 72.0 in | Wt 289.0 lb

## 2024-01-06 DIAGNOSIS — Z0000 Encounter for general adult medical examination without abnormal findings: Secondary | ICD-10-CM | POA: Diagnosis not present

## 2024-01-06 DIAGNOSIS — R972 Elevated prostate specific antigen [PSA]: Secondary | ICD-10-CM | POA: Diagnosis not present

## 2024-01-06 DIAGNOSIS — M170 Bilateral primary osteoarthritis of knee: Secondary | ICD-10-CM

## 2024-01-06 DIAGNOSIS — E785 Hyperlipidemia, unspecified: Secondary | ICD-10-CM

## 2024-01-06 DIAGNOSIS — Z23 Encounter for immunization: Secondary | ICD-10-CM | POA: Diagnosis not present

## 2024-01-06 DIAGNOSIS — F3342 Major depressive disorder, recurrent, in full remission: Secondary | ICD-10-CM

## 2024-01-06 LAB — CBC
HCT: 50.6 % (ref 39.0–52.0)
Hemoglobin: 16.4 g/dL (ref 13.0–17.0)
MCHC: 32.4 g/dL (ref 30.0–36.0)
MCV: 85.5 fL (ref 78.0–100.0)
Platelets: 266 10*3/uL (ref 150.0–400.0)
RBC: 5.91 Mil/uL — ABNORMAL HIGH (ref 4.22–5.81)
RDW: 14.4 % (ref 11.5–15.5)
WBC: 7.6 10*3/uL (ref 4.0–10.5)

## 2024-01-06 LAB — COMPREHENSIVE METABOLIC PANEL
ALT: 33 U/L (ref 0–53)
AST: 28 U/L (ref 0–37)
Albumin: 4.2 g/dL (ref 3.5–5.2)
Alkaline Phosphatase: 54 U/L (ref 39–117)
BUN: 16 mg/dL (ref 6–23)
CO2: 29 meq/L (ref 19–32)
Calcium: 9.2 mg/dL (ref 8.4–10.5)
Chloride: 100 meq/L (ref 96–112)
Creatinine, Ser: 0.91 mg/dL (ref 0.40–1.50)
GFR: 84.57 mL/min (ref >60.00)
Glucose, Bld: 104 mg/dL — ABNORMAL HIGH (ref 70–99)
Potassium: 4.6 meq/L (ref 3.5–5.1)
Sodium: 138 meq/L (ref 135–145)
Total Bilirubin: 0.7 mg/dL (ref 0.2–1.2)
Total Protein: 7.3 g/dL (ref 6.0–8.3)

## 2024-01-06 LAB — LIPID PANEL
Cholesterol: 200 mg/dL (ref 0–200)
HDL: 41.4 mg/dL (ref >39.00)
LDL Cholesterol: 127 mg/dL — ABNORMAL HIGH (ref 0–99)
NonHDL: 158.48
Total CHOL/HDL Ratio: 5
Triglycerides: 157 mg/dL — ABNORMAL HIGH (ref 0.0–149.0)
VLDL: 31.4 mg/dL (ref 0.0–40.0)

## 2024-01-06 LAB — PSA: PSA: 5.83 ng/mL — ABNORMAL HIGH (ref 0.10–4.00)

## 2024-01-06 LAB — HEMOGLOBIN A1C: Hgb A1c MFr Bld: 6.4 % (ref 4.6–6.5)

## 2024-01-06 MED ORDER — SERTRALINE HCL 100 MG PO TABS: 100.0000 mg | ORAL_TABLET | Freq: Every day | ORAL | 3 refills | Status: DC

## 2024-01-06 NOTE — Assessment & Plan Note (Signed)
BMI 39 and complicated by hyperlipidemia and arthritis and depression. Counseled about diet and exercise. Checking HgA1c and lipid panel.

## 2024-01-06 NOTE — Assessment & Plan Note (Signed)
Stable and controlled on zoloft 100 mg daily. Refilled and plan to continue indefinitely.

## 2024-01-06 NOTE — Assessment & Plan Note (Signed)
 Checking lipid panel and adjust as needed.

## 2024-01-06 NOTE — Progress Notes (Signed)
 LVM-1st attempt

## 2024-01-06 NOTE — Progress Notes (Signed)
   Subjective:   Patient ID: Jesse [name], male    DOB: December 11, 1950, 73 y.o.   MRN: [id number]  HPI The patient is here for physical.  PMH, [facility], social history reviewed and updated  Review of Systems  Constitutional: Negative.   HENT: Negative.    Eyes: Negative.   Respiratory:  Negative for cough, chest tightness and shortness of breath.   Cardiovascular:  Negative for chest pain, palpitations and leg swelling.  Gastrointestinal:  Negative for abdominal distention, abdominal pain, constipation, diarrhea, nausea and vomiting.  Musculoskeletal:  Positive for arthralgias.  Skin: Negative.   Neurological: Negative.   Psychiatric/Behavioral: Negative.      Objective:  Physical Exam Constitutional:      Appearance: He is well-developed.  HENT:     Head: Normocephalic and atraumatic.  Cardiovascular:     Rate and Rhythm: Normal rate and regular rhythm.  Pulmonary:     Effort: Pulmonary effort is normal. No respiratory distress.     Breath sounds: Normal breath sounds. No wheezing or rales.  Abdominal:     General: Bowel sounds are normal. There is no distension.     Palpations: Abdomen is soft.     Tenderness: There is no abdominal tenderness. There is no rebound.  Musculoskeletal:        General: Tenderness present.     Cervical back: Normal range of motion.  Skin:    General: Skin is warm and dry.  Neurological:     Mental Status: He is alert and oriented to person, place, and time.     Coordination: Coordination normal.     Vitals:   01/06/24 0757  BP: 128/80  Pulse: 87  Temp: 97.9 F (36.6 C)  TempSrc: Oral  SpO2: 93%  Weight: 289 lb (131.1 kg)  Height: 6' (1.829 m)    Assessment & Plan:  Flu shot given at visit

## 2024-01-06 NOTE — Patient Instructions (Signed)
We will get you in for the knee

## 2024-01-06 NOTE — Assessment & Plan Note (Signed)
Flu shot given. Pneumonia complete. Shingrix due at pharmacy. Tetanus up to date. Colonoscopy up to date. Counseled about sun safety and mole surveillance. Counseled about the dangers of distracted driving. Given 10 year screening recommendations.

## 2024-01-06 NOTE — Assessment & Plan Note (Signed)
Knee pain worsening left more than right. Referral to orthopedics as he is having chronic pain and likely needs injection and assessment.

## 2024-01-06 NOTE — Assessment & Plan Note (Signed)
Last PSA 5 and rising. Checking PSA and depending on value will address.

## 2024-01-09 MED ORDER — ROSUVASTATIN CALCIUM 20 MG PO TABS: 20.0000 mg | ORAL_TABLET | Freq: Every day | ORAL | 3 refills | Status: AC

## 2024-01-09 NOTE — Telephone Encounter (Signed)
Patient agrees to cholesterol medication.

## 2024-01-12 ENCOUNTER — Other Ambulatory Visit (INDEPENDENT_AMBULATORY_CARE_PROVIDER_SITE_OTHER): Payer: Self-pay

## 2024-01-12 ENCOUNTER — Encounter: Payer: Self-pay | Admitting: Orthopaedic Surgery

## 2024-01-12 ENCOUNTER — Ambulatory Visit: Payer: Medicare Other | Admitting: Orthopaedic Surgery

## 2024-01-12 DIAGNOSIS — M25562 Pain in left knee: Secondary | ICD-10-CM

## 2024-01-12 DIAGNOSIS — G8929 Other chronic pain: Secondary | ICD-10-CM | POA: Diagnosis not present

## 2024-01-12 MED ORDER — BUPIVACAINE HCL 0.5 % IJ SOLN
2.0000 mL | INTRAMUSCULAR | Status: AC | PRN
  Administered 2024-01-12: 2 mL via INTRA_ARTICULAR

## 2024-01-12 MED ORDER — LIDOCAINE HCL 1 % IJ SOLN
2.0000 mL | INTRAMUSCULAR | Status: AC | PRN
  Administered 2024-01-12: 2 mL

## 2024-01-12 MED ORDER — METHYLPREDNISOLONE ACETATE 40 MG/ML IJ SUSP
40.0000 mg | INTRAMUSCULAR | Status: AC | PRN
  Administered 2024-01-12: 40 mg via INTRA_ARTICULAR

## 2024-01-12 NOTE — Progress Notes (Signed)
 Office Visit Note   Patient: Jesse [name]           Date of Birth: 1951-07-13           MRN: [id number] Visit Date: 01/12/2024              Requested by:  Myrlene Broker, MD [address] [place],  [place] [address]  PCP: Myrlene Broker, MD   Assessment & Plan: Visit Diagnoses:  1. Chronic pain of left knee     Plan: Jesse [name] is a 73 year old gentleman with left knee pain primarily over the fibular head.  Not really endorsing any symptoms of intra-articular knee pain.  We talked about options and he agreed to try a cortisone injection at the fibular head.  He tolerated this well.  Will see him back if symptoms do not improve from this injection.  Follow-Up Instructions: No follow-ups on file.   Orders:  Orders Placed This Encounter  Procedures   Large Joint Inj: L knee   XR KNEE 3 VIEW LEFT   No orders of the defined types were placed in this encounter.     Procedures: Large Joint Inj: L knee on 01/12/2024 11:26 AM Details: 22 G needle Medications: 2 mL bupivacaine 0.5 %; 2 mL lidocaine 1 %; 40 mg methylPREDNISolone acetate 40 MG/ML Outcome: tolerated well, no immediate complications Patient was prepped and draped in the usual sterile fashion.       Clinical Data: No additional findings.   Subjective: Chief Complaint  Patient presents with   Left Knee - Pain    HPI Jesse [name] is a very pleasant 73 year old gentleman here for evaluation of left knee pain that is constant.  He denies any numbness tingling or burning.  He states that he has pain directly over the fibular head.  Denies any injuries.  Voltaren gel helps slightly.  He states that he had knee scope about 20 years ago and sounds like he had a mini medial meniscus tear.  This was done at [facility] orthopedics. Review of Systems  Constitutional: Negative.   HENT: Negative.    Eyes: Negative.   Respiratory: Negative.    Cardiovascular: Negative.   Gastrointestinal: Negative.   Endocrine:  Negative.   Genitourinary: Negative.   Skin: Negative.   Allergic/Immunologic: Negative.   Neurological: Negative.   Hematological: Negative.   Psychiatric/Behavioral: Negative.    All other systems reviewed and are negative.    Objective: Vital Signs: There were no vitals taken for this visit.  Physical Exam Vitals and nursing note reviewed.  Constitutional:      Appearance: He is well-developed.  HENT:     Head: Normocephalic and atraumatic.  Eyes:     Pupils: Pupils are equal, round, and reactive to light.  Pulmonary:     Effort: Pulmonary effort is normal.  Abdominal:     Palpations: Abdomen is soft.  Musculoskeletal:        General: Normal range of motion.     Cervical back: Neck supple.  Skin:    General: Skin is warm.  Neurological:     Mental Status: He is alert and oriented to person, place, and time.  Psychiatric:        Behavior: Behavior normal.        Thought Content: Thought content normal.        Judgment: Judgment normal.     Ortho Exam Examination of the left knee shows no joint effusion.  Normal range of motion.  He has fair amount of tenderness to the fibular head.  The proximal tib-fib joint is stable.  I do not feel any cysts.  Negative Tinel at the peroneal nerve.  Is not tender along the biceps femoris. Specialty Comments:  No specialty comments available.  Imaging: XR KNEE 3 VIEW LEFT Result Date: 01/12/2024 X-rays of the left knee show minimal osteoarthritis.  No acute abnormalities.    PMFS History: Patient Active Problem List   Diagnosis Date Noted   Elevated PSA 01/06/2024   Former smoker 06/17/2022   Degenerative arthritis of knee, bilateral 09/18/2018   Depression 08/29/2018   Hyperlipidemia 08/17/2016   Morbid obesity (HCC) 03/13/2016   Obstructive sleep apnea 05/14/2013   Routine general medical examination at a health care facility 02/05/2013   Past Medical History:  Diagnosis Date   Allergy Penicillian   Arthritis     Chicken pox    Diverticulitis    Hyperlipidemia    Sleep apnea    57yr ago positive sleep test---never treated    Family History  Problem Relation Age of Onset   Breast cancer Mother    Cancer Mother        ovary,breast   Obesity Mother    Alcohol abuse Father    Heart attack Father    Heart disease Father    Diabetes Father    Early death Father    Cancer Sister        uterus   Cancer Brother    Diabetes Paternal Grandmother    Stroke Paternal Grandmother    Heart attack Paternal Grandfather    Hyperlipidemia Paternal Grandfather    Early death Paternal Grandfather    Heart disease Paternal Grandfather    Colon polyps Cousin    Colon cancer Cousin    Cancer Maternal Aunt    Cancer Maternal Aunt    Cancer Maternal Uncle    Cancer Maternal Aunt    Cancer Maternal Aunt    Cancer Maternal Uncle    Esophageal cancer Neg Hx    Stomach cancer Neg Hx    Rectal cancer Neg Hx     Past Surgical History:  Procedure Laterality Date   CARPAL TUNNEL RELEASE  11/15/2002   right   CATARACT EXTRACTION, BILATERAL  11/15/2012   right, 4/29; left, 3/8   EYE SURGERY  cataract   FINGER SURGERY Left 2018   KNEE ARTHROSCOPY  11/16/2007   left   TONSILLECTOMY  11/15/1954   Social History   Occupational History   Not on file  Tobacco Use   Smoking status: Former    Current packs/day: 0.00    Average packs/day: 1 pack/day for 42.0 years (42.0 ttl pk-yrs)    Types: Cigarettes    Start date: 10/15/1969    Quit date: 10/16/2011    Years since quitting: 12.2   Smokeless tobacco: Never   Tobacco comments:    SMOKED 42 YRS "OFF AND ON"  Vaping Use   Vaping status: Never Used  Substance and Sexual Activity   Alcohol use: Yes    Alcohol/week: 1.0 standard drink of alcohol    Types: 1 Standard drinks or equivalent per week    Comment: Usually 1.5 oz of liquor per day before bed   Drug use: No   Sexual activity: Not Currently    Birth control/protection: None

## 2024-01-17 ENCOUNTER — Other Ambulatory Visit: Payer: Self-pay

## 2024-01-17 MED ORDER — SERTRALINE HCL 100 MG PO TABS: 100.0000 mg | ORAL_TABLET | Freq: Every day | ORAL | 3 refills | Status: AC

## 2024-02-01 DIAGNOSIS — G4733 Obstructive sleep apnea (adult) (pediatric): Secondary | ICD-10-CM | POA: Diagnosis not present

## 2024-02-27 ENCOUNTER — Other Ambulatory Visit: Payer: Self-pay

## 2024-02-27 ENCOUNTER — Ambulatory Visit
Admission: RE | Admit: 2024-02-27 | Discharge: 2024-02-27 | Disposition: A | Discharge status: Home / Self Care | Source: Ambulatory Visit | Attending: Physician Assistant | Admitting: Physician Assistant

## 2024-02-27 VITALS — BP 123/85 | HR 90 | Temp 98.3°F | Resp 18 | Ht 72.0 in | Wt 285.0 lb

## 2024-02-27 DIAGNOSIS — Z20828 Contact with and (suspected) exposure to other viral communicable diseases: Secondary | ICD-10-CM | POA: Diagnosis not present

## 2024-02-27 DIAGNOSIS — R051 Acute cough: Secondary | ICD-10-CM | POA: Diagnosis not present

## 2024-02-27 LAB — POC RSV: RSV Antigen, POC: NEGATIVE

## 2024-02-27 LAB — POC COVID19/FLU A&B COMBO
Covid Antigen, POC: NEGATIVE
Influenza A Antigen, POC: NEGATIVE
Influenza B Antigen, POC: NEGATIVE

## 2024-02-27 NOTE — ED Triage Notes (Addendum)
 Pt presents with complaints of cough, nasal congestion, and sore throat x 10 days. Pt rates his overall pain a 2/10, slight irritation of the throat. OTC antihistamine taken with no relief. Denies fevers at home.

## 2024-02-27 NOTE — Discharge Instructions (Signed)
 You were seen today for concerns for resolving cough, nasal congestion and sore throat.  Overall your physical exam is reassuring as are your vitals.  I suspect that you are recovering from respiratory single virus or RSV.  At this time management is largely to help control symptoms and make you feel more comfortable and sure that you are breathing without issues.  Given your reassuring exam I suspect that you are likely recovering well and you can continue to take over-the-counter occasions as needed.  I recommend taking Robitussin and Mucinex as desired to help with the cough and mucus production.  You can also take Tylenol as needed for body aches and pains.  If at any point you start develop shortness of breath, fevers are not responding to medication, chills, significant difficulty breathing please go to the emergency room as these could be signs of medical emergency.

## 2024-02-27 NOTE — ED Provider Notes (Signed)
 [facility]    CSN: [id number] Arrival date & time: 02/27/24  1049      History   Chief Complaint Chief Complaint  Patient presents with   Cough    Some chest congestion, sore throat. Been sick for 10 days. - Entered by patient    HPI HORACE LUKAS is a 73 y.o. male.   HPI He reports having nasal congestion, postnasal drainage and cough for about 10 days He thinks his symptoms are getting better but reports that they are typically worse at night  He reports his wife developed symptoms after him but he is not aware of recent sick contacts prior to his illness Interventions: yesterday started taking mucinex and started antihistamine about 10 days ago     Past Medical History:  Diagnosis Date   Allergy Penicillian   Arthritis    Chicken pox    Diverticulitis    Hyperlipidemia    Sleep apnea    2yr ago positive sleep test---never treated    Patient Active Problem List   Diagnosis Date Noted   Elevated PSA 01/06/2024   Former smoker 06/17/2022   Degenerative arthritis of knee, bilateral 09/18/2018   Depression 08/29/2018   Hyperlipidemia 08/17/2016   Morbid obesity (HCC) 03/13/2016   Obstructive sleep apnea 05/14/2013   Routine general medical examination at a health care facility 02/05/2013    Past Surgical History:  Procedure Laterality Date   CARPAL TUNNEL RELEASE  11/15/2002   right   CATARACT EXTRACTION, BILATERAL  11/15/2012   right, 4/29; left, 3/8   EYE SURGERY  cataract   FINGER SURGERY Left 2018   KNEE ARTHROSCOPY  11/16/2007   left   TONSILLECTOMY  11/15/1954       Home Medications    Prior to Admission medications   Medication Sig Start Date End Date Taking? Authorizing Provider  aspirin 325 MG tablet Take 325 mg by mouth daily.    [provider]  Ibuprofen 200 MG CAPS Take 1 capsule by mouth 2 (two) times daily.    [provider]  rosuvastatin (CRESTOR) 20 MG tablet Take 1 tablet (20 mg total) by  mouth daily. 01/09/24   Myrlene Broker, MD  sertraline (ZOLOFT) 100 MG tablet Take 1 tablet (100 mg total) by mouth daily. 01/17/24   Myrlene Broker, MD  SODIUM FLUORIDE 5000 PLUS 1.1 % CREA dental cream See admin instructions. Patient not taking: Reported on 12/02/2023 12/01/21   [provider]    Family History Family History  Problem Relation Age of Onset   Breast cancer Mother    Cancer Mother        ovary,breast   Obesity Mother    Alcohol abuse Father    Heart attack Father    Heart disease Father    Diabetes Father    Early death Father    Cancer Sister        uterus   Cancer Brother    Diabetes Paternal Grandmother    Stroke Paternal Grandmother    Heart attack Paternal Grandfather    Hyperlipidemia Paternal Grandfather    Early death Paternal Grandfather    Heart disease Paternal Grandfather    Colon polyps Cousin    Colon cancer Cousin    Cancer Maternal Aunt    Cancer Maternal Aunt    Cancer Maternal Uncle    Cancer Maternal Aunt    Cancer Maternal Aunt    Cancer Maternal Uncle    Esophageal cancer  Neg Hx    Stomach cancer Neg Hx    Rectal cancer Neg Hx     Social History Social History   Tobacco Use   Smoking status: Former    Current packs/day: 0.00    Average packs/day: 1 pack/day for 42.0 years (42.0 ttl pk-yrs)    Types: Cigarettes    Start date: 10/15/1969    Quit date: 10/16/2011    Years since quitting: 12.3   Smokeless tobacco: Never   Tobacco comments:    SMOKED 42 YRS "OFF AND ON"  Vaping Use   Vaping status: Never Used  Substance Use Topics   Alcohol use: Yes    Alcohol/week: 1.0 standard drink of alcohol    Types: 1 Standard drinks or equivalent per week    Comment: Usually 1.5 oz of liquor per day before bed   Drug use: No     Allergies   Penicillins   Review of Systems Review of Systems  Constitutional:  Negative for chills and fever.  HENT:  Positive for congestion, postnasal drip, rhinorrhea and sore  throat.   Respiratory:  Positive for cough. Negative for shortness of breath (during coughing spells) and wheezing.      Physical Exam Triage Vital Signs ED Triage Vitals  Encounter Vitals Group     BP 02/27/24 1059 123/85     Systolic BP Percentile --      Diastolic BP Percentile --      Pulse Rate 02/27/24 1059 90     Resp 02/27/24 1059 18     Temp 02/27/24 1059 98.3 F (36.8 C)     Temp Source 02/27/24 1059 Oral     SpO2 02/27/24 1059 95 %     Weight 02/27/24 1059 285 lb (129.3 kg)     Height 02/27/24 1059 6' (1.829 m)     Head Circumference --      Peak Flow --      Pain Score 02/27/24 1125 2     Pain Loc --      Pain Education --      Exclude from Growth Chart --    No data found.  Updated Vital Signs BP 123/85 (BP Location: Right Arm)   Pulse 90   Temp 98.3 F (36.8 C) (Oral)   Resp 18   Ht 6' (1.829 m)   Wt 285 lb (129.3 kg)   SpO2 95%   BMI 38.65 kg/m   Visual Acuity Right Eye Distance:   Left Eye Distance:   Bilateral Distance:    Right Eye Near:   Left Eye Near:    Bilateral Near:     Physical Exam Vitals reviewed.  Constitutional:      General: He is awake.     Appearance: Normal appearance. He is well-developed and well-groomed.  HENT:     Head: Normocephalic and atraumatic.     Mouth/Throat:     Lips: Pink.     Mouth: Mucous membranes are moist.     Pharynx: Oropharynx is clear. Uvula midline. Posterior oropharyngeal erythema and postnasal drip present. No pharyngeal swelling, oropharyngeal exudate or uvula swelling.     Tonsils: No tonsillar exudate or tonsillar abscesses.  Eyes:     General: Lids are normal. Gaze aligned appropriately.     Extraocular Movements: Extraocular movements intact.  Cardiovascular:     Rate and Rhythm: Normal rate and regular rhythm.     Heart sounds: Normal heart sounds.  Pulmonary:     Effort: Pulmonary  effort is normal.     Breath sounds: Normal breath sounds. No decreased air movement. No decreased  breath sounds, wheezing, rhonchi or rales.  Musculoskeletal:     Cervical back: Normal range of motion and neck supple.  Lymphadenopathy:     Head:     Right side of head: No submental, submandibular or preauricular adenopathy.     Left side of head: No submental, submandibular or preauricular adenopathy.     Cervical:     Right cervical: No superficial cervical adenopathy.    Left cervical: No superficial cervical adenopathy.     Upper Body:     Right upper body: No supraclavicular adenopathy.     Left upper body: No supraclavicular adenopathy.  Skin:    General: Skin is warm and dry.  Neurological:     General: No focal deficit present.     Mental Status: He is alert and oriented to person, place, and time.  Psychiatric:        Mood and Affect: Mood normal.        Behavior: Behavior normal. Behavior is cooperative.        Thought Content: Thought content normal.        Judgment: Judgment normal.      UC Treatments / Results  Labs (all labs ordered are listed, but only abnormal results are displayed) Labs Reviewed  POC COVID19/FLU A&B COMBO  POC RSV    EKG   Radiology No results found.  Procedures Procedures (including critical care time)  Medications Ordered in UC Medications - No data to display  Initial Impression / Assessment and Plan / UC Course  I have reviewed the triage vital signs and the nursing notes.  Pertinent labs & imaging results that were available during my care of the patient were reviewed by me and considered in my medical decision making (see chart for details).      Final Clinical Impressions(s) / UC Diagnoses   Final diagnoses:  Acute cough  Exposure to respiratory syncytial virus (RSV)   Patient presents today with concerns for resolving cough, nasal congestion and mildly irritated sore throat that has been ongoing for about 10 days.  He reports his symptoms seem to be getting better since they started but reports that his wife has  developed similar symptoms.  His wife was also seen in urgent care and tested positive for RSV but patient's testing for RSV and flu, COVID were all negative.  Reviewed with patient that it is likely he had RSV but has recovered and this is why his testing was negative.  Vitals and physical exam are overall reassuring at this time.  Recommend continued over-the-counter medications for symptomatic relief.  ED and return precautions were reviewed and provided in after visit summary.  Follow-up as needed.    Discharge Instructions      You were seen today for concerns for resolving cough, nasal congestion and sore throat.  Overall your physical exam is reassuring as are your vitals.  I suspect that you are recovering from respiratory single virus or RSV.  At this time management is largely to help control symptoms and make you feel more comfortable and sure that you are breathing without issues.  Given your reassuring exam I suspect that you are likely recovering well and you can continue to take over-the-counter occasions as needed.  I recommend taking Robitussin and Mucinex as desired to help with the cough and mucus production.  You can also take Tylenol as  needed for body aches and pains.  If at any point you start develop shortness of breath, fevers are not responding to medication, chills, significant difficulty breathing please go to the emergency room as these could be signs of medical emergency.     ED Prescriptions   None    PDMP not reviewed this encounter.   Demarea Lorey, Pearla Bottom, PA-C 02/27/24 1245

## 2024-03-03 DIAGNOSIS — G4733 Obstructive sleep apnea (adult) (pediatric): Secondary | ICD-10-CM | POA: Diagnosis not present

## 2024-03-07 DIAGNOSIS — K080 Exfoliation of teeth due to systemic causes: Secondary | ICD-10-CM | POA: Diagnosis not present

## 2024-04-20 DIAGNOSIS — G4733 Obstructive sleep apnea (adult) (pediatric): Secondary | ICD-10-CM | POA: Diagnosis not present

## 2024-06-06 ENCOUNTER — Telehealth: Payer: Self-pay | Admitting: Pharmacist

## 2024-06-06 NOTE — Progress Notes (Signed)
 Pharmacy Quality Measure Review  This patient is appearing on a report for being at risk of failing the adherence measure for cholesterol (statin) medications this calendar year.   Medication: Rosuvastatin  20 mg Last fill date: 04/17/24 for 90 day supply  Insurance report was not up to date. No action needed at this time.   Darrelyn Drum, PharmD, BCPS, CPP Clinical Pharmacist Practitioner [facility] [phone]

## 2024-06-12 DIAGNOSIS — K080 Exfoliation of teeth due to systemic causes: Secondary | ICD-10-CM | POA: Diagnosis not present

## 2024-06-19 ENCOUNTER — Encounter: Payer: Self-pay | Admitting: Internal Medicine

## 2024-06-19 DIAGNOSIS — R7303 Prediabetes: Secondary | ICD-10-CM

## 2024-06-19 DIAGNOSIS — E785 Hyperlipidemia, unspecified: Secondary | ICD-10-CM

## 2024-06-19 NOTE — Telephone Encounter (Signed)
**Note De-identified  [place] Obfuscation** Please advise 

## 2024-06-21 ENCOUNTER — Telehealth: Payer: Self-pay | Admitting: Internal Medicine

## 2024-06-21 NOTE — Telephone Encounter (Signed)
 Paperwork was faxed and printed off and was placed in providers box for signatures. Sender is aware of the 5-7 day window but had been calling the clinic for all week for forms to be faxed back for the pt. Pt is fully aware of Rollene being out of office and that when she complete forms upon her return. Please advise, Thanks

## 2024-06-22 DIAGNOSIS — R7303 Prediabetes: Secondary | ICD-10-CM | POA: Insufficient documentation

## 2024-06-22 NOTE — Telephone Encounter (Signed)
 I am not sure I have seen anything for him is there more info you can give me?

## 2024-06-22 NOTE — Telephone Encounter (Signed)
 This was signed by provider already

## 2024-06-27 ENCOUNTER — Telehealth: Payer: Self-pay

## 2024-06-27 NOTE — Telephone Encounter (Signed)
 Patient forms to [employer] were fax back on 06/22/2024 and fax was successful

## 2024-07-09 ENCOUNTER — Telehealth: Payer: Self-pay

## 2024-07-09 NOTE — Telephone Encounter (Signed)
 Copied from CRM #[id number]. Topic: Appointments - Scheduling Inquiry for Clinic >> Jul 09, 2024  2:37 PM Shona S wrote: Reason for CRM: patient calling to schedule lung cancer screening

## 2024-07-10 ENCOUNTER — Other Ambulatory Visit (INDEPENDENT_AMBULATORY_CARE_PROVIDER_SITE_OTHER)

## 2024-07-10 DIAGNOSIS — E785 Hyperlipidemia, unspecified: Secondary | ICD-10-CM

## 2024-07-10 DIAGNOSIS — R7303 Prediabetes: Secondary | ICD-10-CM

## 2024-07-10 LAB — COMPREHENSIVE METABOLIC PANEL WITH GFR
ALT: 33 U/L (ref 0–53)
AST: 28 U/L (ref 0–37)
Albumin: 4.1 g/dL (ref 3.5–5.2)
Alkaline Phosphatase: 53 U/L (ref 39–117)
BUN: 14 mg/dL (ref 6–23)
CO2: 30 meq/L (ref 19–32)
Calcium: 8.8 mg/dL (ref 8.4–10.5)
Chloride: 100 meq/L (ref 96–112)
Creatinine, Ser: 0.88 mg/dL (ref 0.40–1.50)
GFR: 85.98 mL/min (ref >60.00)
Glucose, Bld: 115 mg/dL — ABNORMAL HIGH (ref 70–99)
Potassium: 4.2 meq/L (ref 3.5–5.1)
Sodium: 140 meq/L (ref 135–145)
Total Bilirubin: 0.5 mg/dL (ref 0.2–1.2)
Total Protein: 7.4 g/dL (ref 6.0–8.3)

## 2024-07-10 LAB — LIPID PANEL
Cholesterol: 125 mg/dL (ref 0–200)
HDL: 40.8 mg/dL (ref >39.00)
LDL Cholesterol: 66 mg/dL (ref 0–99)
NonHDL: 84.52
Total CHOL/HDL Ratio: 3
Triglycerides: 92 mg/dL (ref 0.0–149.0)
VLDL: 18.4 mg/dL (ref 0.0–40.0)

## 2024-07-10 LAB — HEMOGLOBIN A1C: Hgb A1c MFr Bld: 6.9 % — ABNORMAL HIGH (ref 4.6–6.5)

## 2024-07-10 NOTE — Telephone Encounter (Signed)
 Called and spoke with pt. Patient quit smoking 16 years ago this year and would no longer qualify for LCS per CMS guidelines. Patient has been informed of this information. Patient still expressed interest in screening. Advised patient we could inform his PCP that he is no longer eligible but he could discuss with his PCP about having a CT chest wo to monitor his lung nodules. Pt verbalized understanding and denied any further questions or concerns at this time.

## 2024-07-11 ENCOUNTER — Ambulatory Visit: Payer: Self-pay | Admitting: Internal Medicine

## 2024-07-11 NOTE — Telephone Encounter (Signed)
 FYI

## 2024-07-24 ENCOUNTER — Encounter: Payer: Self-pay | Admitting: Internal Medicine

## 2024-07-24 ENCOUNTER — Telehealth: Payer: Self-pay | Admitting: Radiology

## 2024-07-24 NOTE — Telephone Encounter (Signed)
 Copied from CRM [id number]. Topic: Clinical - Medication Question >> Jul 24, 2024  2:55 PM Robinson H wrote: Reason for CRM: Patient would like to have a prescription sent for covid vaccine to the [facility] on file  [place] [phone]

## 2024-07-25 MED ORDER — COVID-19 MRNA VACC (MODERNA) 50 MCG/0.5ML IM SUSP: 0.5000 mL | Freq: Once | INTRAMUSCULAR | 0 refills | Status: AC

## 2024-07-25 NOTE — Telephone Encounter (Signed)
 Sent!

## 2024-07-25 NOTE — Addendum Note (Signed)
 Addended by: ROLLENE NORRIS A on: 07/25/2024 09:01 AM   Modules accepted: Orders

## 2024-07-31 ENCOUNTER — Ambulatory Visit: Admitting: Orthopaedic Surgery

## 2024-08-07 ENCOUNTER — Encounter: Payer: Self-pay | Admitting: Adult Health

## 2024-08-07 ENCOUNTER — Ambulatory Visit: Admitting: Adult Health

## 2024-08-07 VITALS — BP 128/84 | HR 73 | Temp 98.3°F | Ht 72.0 in | Wt 295.0 lb

## 2024-08-07 DIAGNOSIS — Z23 Encounter for immunization: Secondary | ICD-10-CM | POA: Diagnosis not present

## 2024-08-07 DIAGNOSIS — G4733 Obstructive sleep apnea (adult) (pediatric): Secondary | ICD-10-CM | POA: Diagnosis not present

## 2024-08-07 DIAGNOSIS — Z6841 Body Mass Index (BMI) 40.0 and over, adult: Secondary | ICD-10-CM | POA: Diagnosis not present

## 2024-08-07 NOTE — Progress Notes (Signed)
 @Patient  ID: Jesse [name], male    DOB: 1951-02-28, 73 y.o.   MRN: [id number]  Chief Complaint  Patient presents with   Sleep Apnea    Referring provider: Rollene Almarie DELENA, *  HPI: 73 yo male former smoker seen for sleep consult August 2023 to re-establish for sleep apnea.  Former patient of Dr. Jude Participates in the lung cancer CT [facility]  screening program  TEST/EVENTS :  NPSG 07/2013:  AHI 67/hr   LDCT chest 08/2023 multiple small nodules , largest 5.71mm, Mild emphysema   08/07/2024 Follow up : OSA Patient presents for a 1 year follow-up.  Says he is doing very well on nocturnal CPAP.  Wears a CPAP every single night.  Feels that he benefits from CPAP with decreased daytime sleepiness.  CPAP download shows excellent compliance with 100% usage.  Daily average usage at 6.5 hours he is on auto CPAP 17 to 19 cm H2O.  Daily average pressure at 18 cm H2O.  AHI 2.7/hour.  Uses [employer] for DME  Says overall he is doing very well.  Remains independent.  Says he is sleeping well and feels rested.  Patient has a history of smoking.  Participates in the lung cancer CT chest screening program.  Last CT chest was October 2024 that showed stable multiple small nodules largest measuring 5.3 mm.  Patient has notable mild emphysema on CT imaging.  Says overall he has no known breathing problems.  Is not on any inhalers.  No previous PFTs.  Discussed flu shot today.    Allergies  Allergen Reactions   Penicillins Swelling    Causes severe facial swelling    Immunization History  Administered Date(s) Administered   Fluad Quad(high Dose 65+) 09/04/2019, 10/27/2020, 09/11/2021, 08/04/2022   Fluad Trivalent(High Dose 65+) 01/06/2024   INFLUENZA, HIGH DOSE SEASONAL PF 08/22/2017, 08/28/2018   Influenza Whole 09/15/2012   Influenza,inj,Quad PF,6+ Mos 08/31/2013, 09/30/2014, 08/11/2015, 08/17/2016   PFIZER(Purple Top)SARS-COV-2 Vaccination 12/30/2019, 01/22/2020, 08/05/2020    Pneumococcal Conjugate-13 08/28/2018   Pneumococcal Polysaccharide-23 09/04/2019   Tdap 08/17/2016, 09/16/2017    Past Medical History:  Diagnosis Date   Allergy Penicillian   Arthritis    Chicken pox    Diverticulitis    Hyperlipidemia    Sleep apnea    73yr ago positive sleep test---never treated    Tobacco History: Social History   Tobacco Use  Smoking Status Former   Current packs/day: 0.00   Average packs/day: 1 pack/day for 42.0 years (42.0 ttl pk-yrs)   Types: Cigarettes   Start date: 10/15/1969   Quit date: 10/16/2011   Years since quitting: 12.8  Smokeless Tobacco Never  Tobacco Comments   SMOKED 42 YRS OFF AND ON   Counseling given: Not Answered Tobacco comments: SMOKED 42 YRS OFF AND ON   Outpatient Medications Prior to Visit  Medication Sig Dispense Refill   aspirin 325 MG tablet Take 325 mg by mouth daily.     Ibuprofen 200 MG CAPS Take 1 capsule by mouth 2 (two) times daily.     rosuvastatin  (CRESTOR ) 20 MG tablet Take 1 tablet (20 mg total) by mouth daily. 90 tablet 3   sertraline  (ZOLOFT ) 100 MG tablet Take 1 tablet (100 mg total) by mouth daily. 90 tablet 3   SODIUM FLUORIDE 5000 PLUS 1.1 % CREA dental cream See admin instructions. (Patient not taking: Reported on 08/07/2024)     No facility-administered medications prior to visit.     Review of Systems:  Constitutional:   No  weight loss, night sweats,  Fevers, chills, fatigue, or  lassitude.  HEENT:   No headaches,  Difficulty swallowing,  Tooth/dental problems, or  Sore throat,                No sneezing, itching, ear ache, nasal congestion, post nasal drip,   CV:  No chest pain,  Orthopnea, PND, swelling in lower extremities, anasarca, dizziness, palpitations, syncope.   GI  No heartburn, indigestion, abdominal pain, nausea, vomiting, diarrhea, change in bowel habits, loss of appetite, bloody stools.   Resp: No shortness of breath with exertion or at rest.  No excess mucus, no  productive cough,  No non-productive cough,  No coughing up of blood.  No change in color of mucus.  No wheezing.  No chest wall deformity  Skin: no rash or lesions.  GU: no dysuria, change in color of urine, no urgency or frequency.  No flank pain, no hematuria   MS:  No joint pain or swelling.  No decreased range of motion.  No back pain.    Physical Exam  BP 128/84   Pulse 73   Temp 98.3 F (36.8 C) (Oral)   Ht 6' (1.829 m)   Wt 295 lb (133.8 kg)   SpO2 94%   BMI 40.01 kg/m   GEN: A/Ox3; pleasant , NAD, well nourished    HEENT:  [place]/AT,  NOSE-clear, THROAT-clear, no lesions, no postnasal drip or exudate noted.   NECK:  Supple w/ fair ROM; no JVD; normal carotid impulses w/o bruits; no thyromegaly or nodules palpated; no lymphadenopathy.    RESP  Clear  P & A; w/o, wheezes/ rales/ or rhonchi. no accessory muscle use, no dullness to percussion  CARD:  RRR, no m/r/g, no peripheral edema, pulses intact, no cyanosis or clubbing.  GI:   Soft & nt; nml bowel sounds; no organomegaly or masses detected.   Musco: Warm bil, no deformities or joint swelling noted.   Neuro: alert, no focal deficits noted.    Skin: Warm, no lesions or rashes    Lab Results:  CBC    Component Value Date/Time   WBC 7.6 01/06/2024 0846   RBC 5.91 (H) 01/06/2024 0846   HGB 16.4 01/06/2024 0846   HCT 50.6 01/06/2024 0846   PLT 266.0 01/06/2024 0846   MCV 85.5 01/06/2024 0846   MCHC 32.4 01/06/2024 0846   RDW 14.4 01/06/2024 0846   LYMPHSABS 2.0 08/18/2015 1012   MONOABS 0.6 08/18/2015 1012   EOSABS 0.1 08/18/2015 1012   BASOSABS 0.0 08/18/2015 1012    BMET    Component Value Date/Time   NA 140 07/10/2024 0815   K 4.2 07/10/2024 0815   CL 100 07/10/2024 0815   CO2 30 07/10/2024 0815   GLUCOSE 115 (H) 07/10/2024 0815   BUN 14 07/10/2024 0815   CREATININE 0.88 07/10/2024 0815   CALCIUM  8.8 07/10/2024 0815    BNP No results found for: BNP  ProBNP No results found for:  PROBNP  Imaging: No results found.  Administration History     None           No data to display          No results found for: NITRICOXIDE      Assessment & Plan:   Obstructive sleep apnea Excellent control and compliance on nocturnal CPAP.  Continue on current settings CPAP care discussed in detail  Plan  Patient Instructions  Flu shot today  Continue  CPAP At bedtime, wear all night long .  Work on healthy weight  Do not drive if sleepy  Healthy sleep regimen  Follow up with Dr. Jude or Theodoro Koval NP in 1 year and  As needed   '   Morbid obesity (HCC) Healthy weight loss discussed in detail.     Madelin Stank, NP 08/07/2024

## 2024-08-07 NOTE — Assessment & Plan Note (Signed)
Healthy weight loss discussed in detail 

## 2024-08-07 NOTE — Addendum Note (Signed)
 Addended by: MOODY RANCHER on: 08/07/2024 04:14 PM   Modules accepted: Orders

## 2024-08-07 NOTE — Assessment & Plan Note (Signed)
 Excellent control and compliance on nocturnal CPAP.  Continue on current settings CPAP care discussed in detail  Plan  Patient Instructions  Flu shot today  Continue CPAP At bedtime, wear all night long .  Work on healthy weight  Do not drive if sleepy  Healthy sleep regimen  Follow up with Dr. Jude or Shraga Custard NP in 1 year and  As needed   '

## 2024-08-07 NOTE — Patient Instructions (Addendum)
 Flu shot today  Continue CPAP At bedtime, wear all night long .  Work on healthy weight  Do not drive if sleepy  Healthy sleep regimen  Follow up with Dr. Jude or Eshawn Coor NP in 1 year and  As needed

## 2024-08-21 ENCOUNTER — Ambulatory Visit (INDEPENDENT_AMBULATORY_CARE_PROVIDER_SITE_OTHER): Admitting: Orthopaedic Surgery

## 2024-08-21 DIAGNOSIS — M1712 Unilateral primary osteoarthritis, left knee: Secondary | ICD-10-CM | POA: Diagnosis not present

## 2024-08-21 MED ORDER — METHYLPREDNISOLONE ACETATE 40 MG/ML IJ SUSP
40.0000 mg | INTRAMUSCULAR | Status: AC | PRN
  Administered 2024-08-21: 40 mg via INTRA_ARTICULAR

## 2024-08-21 MED ORDER — LIDOCAINE HCL 1 % IJ SOLN
2.0000 mL | INTRAMUSCULAR | Status: AC | PRN
  Administered 2024-08-21: 2 mL

## 2024-08-21 MED ORDER — BUPIVACAINE HCL 0.5 % IJ SOLN
2.0000 mL | INTRAMUSCULAR | Status: AC | PRN
  Administered 2024-08-21: 2 mL via INTRA_ARTICULAR

## 2024-08-21 NOTE — Progress Notes (Signed)
 Office Visit Note   Patient: Jesse [name]           Date of Birth: 1951/10/29           MRN: [id number] Visit Date: 08/21/2024              Requested by: Rollene Almarie DELENA, MD [address] [place],  [place] [address] PCP: Rollene Almarie DELENA, MD   Assessment & Plan: Visit Diagnoses:  1. Primary osteoarthritis of left knee     Plan: History of Present Illness Jesse [name] is a 73 year old male with knee arthritis who presents with recurrent left knee pain and swelling.  Three weeks ago, he experienced a return of left knee pain with swelling and a clicking sensation. The swelling has decreased, and the clicking has resolved, but pain and discomfort persist. A previous injection provided relief for nearly six weeks.  Left knee exam shows small joint effusion, otherwise normal exam.  Assessment and Plan Left knee osteoarthritis Chronic osteoarthritis with recent exacerbation causing swelling and pain. Previous injection effective for six weeks. - Administer knee injection for pain relief.  Follow-Up Instructions: No follow-ups on file.   Orders:  No orders of the defined types were placed in this encounter.  No orders of the defined types were placed in this encounter.     Procedures: Large Joint Inj: L knee on 08/21/2024 3:07 PM Details: 22 G needle Medications: 2 mL bupivacaine  0.5 %; 2 mL lidocaine  1 %; 40 mg methylPREDNISolone  acetate 40 MG/ML Outcome: tolerated well, no immediate complications Patient was prepped and draped in the usual sterile fashion.       Clinical Data: No additional findings.   Subjective: Chief Complaint  Patient presents with   Left Knee - Pain    Wants repeat injection today    HPI  Review of Systems  Constitutional: Negative.   HENT: Negative.    Eyes: Negative.   Respiratory: Negative.    Cardiovascular: Negative.   Gastrointestinal: Negative.   Endocrine: Negative.   Genitourinary:  Negative.   Skin: Negative.   Allergic/Immunologic: Negative.   Neurological: Negative.   Hematological: Negative.   Psychiatric/Behavioral: Negative.    All other systems reviewed and are negative.    Objective: Vital Signs: There were no vitals taken for this visit.  Physical Exam Vitals and nursing note reviewed.  Constitutional:      Appearance: He is well-developed.  HENT:     Head: Normocephalic and atraumatic.  Eyes:     Pupils: Pupils are equal, round, and reactive to light.  Pulmonary:     Effort: Pulmonary effort is normal.  Abdominal:     Palpations: Abdomen is soft.  Musculoskeletal:        General: Normal range of motion.     Cervical back: Neck supple.  Skin:    General: Skin is warm.  Neurological:     Mental Status: He is alert and oriented to person, place, and time.  Psychiatric:        Behavior: Behavior normal.        Thought Content: Thought content normal.        Judgment: Judgment normal.     Ortho Exam  Specialty Comments:  No specialty comments available.  Imaging: No results found.   PMFS History: Patient Active Problem List   Diagnosis Date Noted   Pre-diabetes 06/22/2024   Elevated PSA 01/06/2024   Former smoker 06/17/2022   Degenerative arthritis of  knee, bilateral 09/18/2018   Depression 08/29/2018   Hyperlipidemia 08/17/2016   Morbid obesity (HCC) 03/13/2016   Obstructive sleep apnea 05/14/2013   Routine general medical examination at a health care facility 02/05/2013   Past Medical History:  Diagnosis Date   Allergy Penicillian   Arthritis    Chicken pox    Diverticulitis    Hyperlipidemia    Sleep apnea    34yr ago positive sleep test---never treated    Family History  Problem Relation Age of Onset   Breast cancer Mother    Cancer Mother        ovary,breast   Obesity Mother    Alcohol abuse Father    Heart attack Father    Heart disease Father    Diabetes Father    Early death Father    Cancer Sister         uterus   Cancer Brother    Diabetes Paternal Grandmother    Stroke Paternal Grandmother    Heart attack Paternal Grandfather    Hyperlipidemia Paternal Grandfather    Early death Paternal Grandfather    Heart disease Paternal Grandfather    Colon polyps Cousin    Colon cancer Cousin    Cancer Maternal Aunt    Cancer Maternal Aunt    Cancer Maternal Uncle    Cancer Maternal Aunt    Cancer Maternal Aunt    Cancer Maternal Uncle    Esophageal cancer Neg Hx    Stomach cancer Neg Hx    Rectal cancer Neg Hx     Past Surgical History:  Procedure Laterality Date   CARPAL TUNNEL RELEASE  11/15/2002   right   CATARACT EXTRACTION, BILATERAL  11/15/2012   right, 4/29; left, 3/8   EYE SURGERY  cataract   FINGER SURGERY Left 2018   KNEE ARTHROSCOPY  11/16/2007   left   TONSILLECTOMY  11/15/1954   Social History   Occupational History   Not on file  Tobacco Use   Smoking status: Former    Current packs/day: 0.00    Average packs/day: 1 pack/day for 42.0 years (42.0 ttl pk-yrs)    Types: Cigarettes    Start date: 10/15/1969    Quit date: 10/16/2011    Years since quitting: 12.8   Smokeless tobacco: Never   Tobacco comments:    SMOKED 42 YRS OFF AND ON  Vaping Use   Vaping status: Never Used  Substance and Sexual Activity   Alcohol use: Yes    Alcohol/week: 1.0 standard drink of alcohol    Types: 1 Standard drinks or equivalent per week    Comment: Usually 1.5 oz of liquor per day before bed   Drug use: No   Sexual activity: Not Currently    Birth control/protection: None

## 2024-09-17 ENCOUNTER — Encounter: Payer: Self-pay | Admitting: Radiology

## 2024-09-18 ENCOUNTER — Encounter: Payer: Self-pay | Admitting: Internal Medicine

## 2024-09-19 DIAGNOSIS — K080 Exfoliation of teeth due to systemic causes: Secondary | ICD-10-CM | POA: Diagnosis not present

## 2024-12-03 ENCOUNTER — Ambulatory Visit: Payer: Medicare Other

## 2024-12-19 ENCOUNTER — Ambulatory Visit

## 2024-12-19 ENCOUNTER — Other Ambulatory Visit: Payer: Self-pay

## 2024-12-19 VITALS — Ht 72.0 in | Wt 295.0 lb

## 2024-12-19 DIAGNOSIS — Z0000 Encounter for general adult medical examination without abnormal findings: Secondary | ICD-10-CM

## 2024-12-19 DIAGNOSIS — Z122 Encounter for screening for malignant neoplasm of respiratory organs: Secondary | ICD-10-CM

## 2024-12-19 DIAGNOSIS — Z87891 Personal history of nicotine dependence: Secondary | ICD-10-CM

## 2024-12-19 NOTE — Patient Instructions (Signed)
 Mr. Deckman,  Thank you for taking the time for your Medicare Wellness Visit. I appreciate your continued commitment to your health goals. Please review the care plan we discussed, and feel free to reach out if I can assist you further.  Please note that Annual Wellness Visits do not include a physical exam. Some assessments may be limited, especially if the visit was conducted virtually. If needed, we may recommend an in-person follow-up with your provider.  Ongoing Care Seeing your primary care provider every 3 to 6 months helps us  monitor your health and provide consistent, personalized care.   Referrals If a referral was made during today's visit and you haven't received any updates within two weeks, please contact the referred provider directly to check on the status.  Recommended Screenings:  Health Maintenance  Topic Date Due   Zoster (Shingles) Vaccine (1 of 2) Never done   Screening for Lung Cancer  09/11/2024   COVID-19 Vaccine (5 - 2025-26 season) 02/22/2025   Colon Cancer Screening  04/28/2025   Medicare Annual Wellness Visit  12/19/2025   DTaP/Tdap/Td vaccine (3 - Td or Tdap) 09/17/2027   Pneumococcal Vaccine for age over 71  Completed   Flu Shot  Completed   Hepatitis C Screening  Completed   Meningitis B Vaccine  Aged Out   Cologuard (Stool DNA test)  Discontinued       12/02/2023    1:09 PM  Advanced Directives  Does Patient Have a Medical Advance Directive? Yes  Type of Estate Agent of [place];Living will  Copy of Healthcare Power of Attorney in Chart? No - copy requested    Vision: Annual vision screenings are recommended for early detection of glaucoma, cataracts, and diabetic retinopathy. These exams can also reveal signs of chronic conditions such as diabetes and high blood pressure.  Dental: Annual dental screenings help detect early signs of oral cancer, gum disease, and other conditions linked to overall health, including heart  disease and diabetes.

## 2024-12-19 NOTE — Progress Notes (Signed)
 "  Chief Complaint  Patient presents with   Medicare Wellness     Subjective:   Jesse [name] is a 74 y.o. male who presents for a Medicare Annual Wellness Visit.  Visit info / Clinical Intake: Medicare Wellness Visit Type:: Subsequent Annual Wellness Visit Persons participating in visit and providing information:: patient Medicare Wellness Visit Mode:: Video Since this visit was completed virtually, some vitals may be partially provided or unavailable. Missing vitals are due to the limitations of the virtual format.: Documented vitals are patient reported If Telephone or Video please confirm:: I connected with patient using audio/video enable telemedicine. I verified patient identity with two identifiers, discussed telehealth limitations, and patient agreed to proceed. Patient Location:: Home Provider Location:: Office Interpreter Needed?: No Pre-visit prep was completed: yes AWV questionnaire completed by patient prior to visit?: yes Date:: 12/18/24 Living arrangements:: lives with spouse/significant other Patient's Overall Health Status Rating: good Typical amount of pain: some Does pain affect daily life?: no Are you currently prescribed opioids?: no  Dietary Habits and Nutritional Risks How many meals a day?: 2 Eats fruit and vegetables daily?: yes Most meals are obtained by: preparing own meals; eating out In the last 2 weeks, have you had any of the following?: none Diabetic:: no  Functional Status Activities of Daily Living (to include ambulation/medication): Independent Ambulation: Independent with device- listed below Home Assistive Devices/Equipment: Eyeglasses; CPAP Medication Administration: Independent Home Management (perform basic housework or laundry): Independent Manage your own finances?: yes Primary transportation is: driving Concerns about vision?: no *vision screening is required for WTM* Concerns about hearing?: no  Fall Screening Falls in  the past year?: 0 Number of falls in past year: 0 Was there an injury with Fall?: 0 Fall Risk Category Calculator: 0 Patient Fall Risk Level: Low Fall Risk  Fall Risk Patient at Risk for Falls Due to: No Fall Risks Fall risk Follow up: Falls evaluation completed; Falls prevention discussed  Home and Transportation Safety: All rugs have non-skid backing?: (!) no All stairs or steps have railings?: N/A, no stairs Grab bars in the bathtub or shower?: yes Have non-skid surface in bathtub or shower?: yes Good home lighting?: yes Regular seat belt use?: yes Hospital stays in the last year:: no  Cognitive Assessment Difficulty concentrating, remembering, or making decisions? : no Will 6CIT or Mini Cog be Completed: yes What year is it?: 0 points What month is it?: 0 points Give patient an address phrase to remember (5 components): [address] [place], [place] About what time is it?: 0 points Count backwards from 20 to 1: 0 points Say the months of the year in reverse: 0 points Repeat the address phrase from earlier: 0 points 6 CIT Score: 0 points  Advance Directives (For Healthcare) Does Patient Have a Medical Advance Directive?: Yes Does patient want to make changes to medical advance directive?: Yes (Inpatient - patient requests chaplain consult to change a medical advance directive) Type of Advance Directive: Healthcare Power of [place]; Living will Copy of Healthcare Power of Attorney in Chart?: No - copy requested Copy of Living Will in Chart?: No - copy requested  Reviewed/Updated  Reviewed/Updated: Reviewed All (Medical, Surgical, Family, Medications, Allergies, Care Teams, Patient Goals)    Allergies (verified) Penicillins   Current Medications (verified) Outpatient Encounter Medications as of 12/19/2024  Medication Sig   aspirin 325 MG tablet Take 325 mg by mouth daily.   Ibuprofen 200 MG CAPS Take 1 capsule by mouth 2 (two) times daily.  rosuvastatin  (CRESTOR ) 20  MG tablet Take 1 tablet (20 mg total) by mouth daily.   sertraline  (ZOLOFT ) 100 MG tablet Take 1 tablet (100 mg total) by mouth daily.   [DISCONTINUED] SODIUM FLUORIDE 5000 PLUS 1.1 % CREA dental cream See admin instructions. (Patient not taking: Reported on 08/07/2024)   No facility-administered encounter medications on file as of 12/19/2024.    History: Past Medical History:  Diagnosis Date   Allergy Penicillian   Arthritis    Chicken pox    Diverticulitis    Hyperlipidemia    Sleep apnea    17yr ago positive sleep test---never treated   Past Surgical History:  Procedure Laterality Date   CARPAL TUNNEL RELEASE  11/15/2002   right   CATARACT EXTRACTION, BILATERAL  11/15/2012   right, 4/29; left, 3/8   EYE SURGERY  cataract   FINGER SURGERY Left 2018   KNEE ARTHROSCOPY  11/16/2007   left   TONSILLECTOMY  11/15/1954   Family History  Problem Relation Age of Onset   Breast cancer Mother    Cancer Mother        ovary,breast   Obesity Mother    Alcohol abuse Father    Heart attack Father    Heart disease Father    Diabetes Father    Early death Father    Cancer Sister        uterus   Cancer Brother    Diabetes Paternal Grandmother    Stroke Paternal Grandmother    Heart attack Paternal Grandfather    Hyperlipidemia Paternal Grandfather    Early death Paternal Grandfather    Heart disease Paternal Grandfather    Colon polyps Cousin    Colon cancer Cousin    Cancer Maternal Aunt    Cancer Maternal Aunt    Cancer Maternal Uncle    Cancer Maternal Aunt    Cancer Maternal Aunt    Cancer Maternal Uncle    Esophageal cancer Neg Hx    Stomach cancer Neg Hx    Rectal cancer Neg Hx    Social History   Occupational History   Not on file  Tobacco Use   Smoking status: Former    Current packs/day: 0.00    Average packs/day: 1 pack/day for 42.0 years (42.0 ttl pk-yrs)    Types: Cigarettes    Start date: 10/15/1969    Quit date: 10/16/2011    Years since quitting:  13.1   Smokeless tobacco: Never   Tobacco comments:    SMOKED 42 YRS OFF AND ON  Vaping Use   Vaping status: Never Used  Substance and Sexual Activity   Alcohol use: Yes    Alcohol/week: 2.0 standard drinks of alcohol    Types: 1 Shots of liquor, 1 Standard drinks or equivalent per week    Comment: Usually 1.5 oz of liquor per day before bed   Drug use: No   Sexual activity: Yes    Birth control/protection: None   Tobacco Counseling Counseling given: Not Answered Tobacco comments: SMOKED 42 YRS OFF AND ON  SDOH Screenings   Food Insecurity: No Food Insecurity (12/19/2024)  Housing: Low Risk (12/19/2024)  Transportation Needs: No Transportation Needs (12/19/2024)  Utilities: Not At Risk (12/19/2024)  Alcohol Screen: Low Risk (12/18/2024)  Depression (PHQ2-9): Low Risk (12/19/2024)  Financial Resource Strain: Low Risk (12/18/2024)  Physical Activity: Inactive (12/19/2024)  Social Connections: Socially Isolated (12/19/2024)  Stress: No Stress Concern Present (12/19/2024)  Tobacco Use: Medium Risk (12/19/2024)  Health Literacy: Adequate Health  Literacy (12/19/2024)   See flowsheets for full screening details  Depression Screen PHQ 2 & 9 Depression Scale- Over the past 2 weeks, how often have you been bothered by any of the following problems? Little interest or pleasure in doing things: 0 Feeling down, depressed, or hopeless (PHQ Adolescent also includes...irritable): 0 PHQ-2 Total Score: 0 Trouble falling or staying asleep, or sleeping too much: 0 Feeling tired or having little energy: 0 Poor appetite or overeating (PHQ Adolescent also includes...weight loss): 0 Feeling bad about yourself - or that you are a failure or have let yourself or your family down: 0 Trouble concentrating on things, such as reading the newspaper or watching television (PHQ Adolescent also includes...like school work): 0 Moving or speaking so slowly that other people could have noticed. Or the opposite - being so  fidgety or restless that you have been moving around a lot more than usual: 0 Thoughts that you would be better off dead, or of hurting yourself in some way: 0 PHQ-9 Total Score: 0 If you checked off any problems, how difficult have these problems made it for you to do your work, take care of things at home, or get along with other people?: Not difficult at all  Depression Treatment Depression Interventions/Treatment : EYV7-0 Score <4 Follow-up Not Indicated     Goals Addressed               This Visit's Progress     Patient Stated (pt-stated)        Patient stated he plans to stay active             Objective:    Today's Vitals   12/19/24 1132  Weight: 295 lb (133.8 kg)  Height: 6' (1.829 m)   Body mass index is 40.01 kg/m.  Hearing/Vision screen Hearing Screening - Comments:: Wears hearing aids Vision Screening - Comments:: Wears eyeglasses for reading - up to date with routine eye exams with Elspeth Aran Immunizations and Health Maintenance Health Maintenance  Topic Date Due   Zoster Vaccines- Shingrix (1 of 2) Never done   Lung Cancer Screening  09/11/2024   COVID-19 Vaccine (5 - 2025-26 season) 02/22/2025   Colonoscopy  04/28/2025   Medicare Annual Wellness (AWV)  12/19/2025   DTaP/Tdap/Td (3 - Td or Tdap) 09/17/2027   Pneumococcal Vaccine: 50+ Years  Completed   Influenza Vaccine  Completed   Hepatitis C Screening  Completed   Meningococcal B Vaccine  Aged Out   Fecal DNA (Cologuard)  Discontinued        Assessment/Plan:  This is a routine wellness examination for Jesse [name].  Lung Cancer Screening: ordered today  Patient Care Team: Rollene Almarie LABOR, MD as PCP - General (Internal Medicine) Jude Harden GAILS, MD as Consulting Physician (Pulmonary Disease) Aran Elspeth PARAS, MD as Consulting Physician (Ophthalmology)  I have personally reviewed and noted the following in the patients chart:   Medical and social history Use of alcohol, tobacco or  illicit drugs  Current medications and supplements including opioid prescriptions. Functional ability and status Nutritional status Physical activity Advanced directives List of other physicians Hospitalizations, surgeries, and ER visits in previous 12 months Vitals Screenings to include cognitive, depression, and falls Referrals and appointments  Orders Placed This Encounter  Procedures   Ambulatory Referral for Lung Cancer Scre    Referral Priority:   Routine    Referral Type:   Consultation    Referral Reason:   Specialty Services Required    Referred  to Provider:   Shelah Lamar RAMAN, MD    Number of Visits Requested:   1   In addition, I have reviewed and discussed with patient certain preventive protocols, quality metrics, and best practice recommendations. A written personalized care plan for preventive services as well as general preventive health recommendations were provided to patient.   Verdie CHRISTELLA Saba, CMA   12/19/2024   Return in 1 year (on 12/19/2025).  After Visit Summary: (MyChart) Due to this being a telephonic visit, the after visit summary with patients personalized plan was offered to patient via MyChart   Nurse Notes: scheduled 2027 AWV appt "

## 2025-01-07 ENCOUNTER — Encounter: Admitting: Internal Medicine

## 2026-01-10 ENCOUNTER — Ambulatory Visit

## 2026-01-10 ENCOUNTER — Encounter: Admitting: Internal Medicine
